# Patient Record
Sex: Female | Born: 1980 | Hispanic: No | Marital: Married | Smoking: Never smoker
Health system: Southern US, Community
[De-identification: ages and names within clinical notes are randomized; demographics above are authoritative.]

## PROBLEM LIST (undated history)

## (undated) HISTORY — PX: OTHER SURGICAL HISTORY: SHX169

---

## 2009-02-03 HISTORY — PX: OTHER SURGICAL HISTORY: SHX169

## 2009-02-03 HISTORY — PX: LIPOSUCTION EXTREMITIES: SUR830

## 2009-07-06 ENCOUNTER — Encounter: Payer: Self-pay | Admitting: Family Medicine

## 2010-03-05 NOTE — Consult Note (Signed)
Summary: Consultation Report  Consultation Report   Imported By: Marily Memos 07/06/2009 10:24:00  _____________________________________________________________________  External Attachment:    Type:   Image     Comment:   External Document

## 2010-04-09 ENCOUNTER — Ambulatory Visit (INDEPENDENT_AMBULATORY_CARE_PROVIDER_SITE_OTHER): Payer: BC Managed Care – PPO | Admitting: Sports Medicine

## 2010-04-09 ENCOUNTER — Encounter: Payer: Self-pay | Admitting: Sports Medicine

## 2010-04-09 DIAGNOSIS — M7650 Patellar tendinitis, unspecified knee: Secondary | ICD-10-CM | POA: Insufficient documentation

## 2010-04-09 DIAGNOSIS — M25569 Pain in unspecified knee: Secondary | ICD-10-CM

## 2010-04-16 NOTE — Assessment & Plan Note (Signed)
Summary: RUNNING EVAL/PATELLA TENDONITS   Vitals Entered By: Lillia Pauls CMA (April 09, 2010 11:35 AM)   CC:  patellar tendonitis.  History of Present Illness: [by Christin Gethers MSIV]  Beverly Ross is a 30 year old who presents  with a primary complaint of patellar tendonitis.  Approximately 14 months ago (Jan/2011) patient ran in a marathon using the [facility] training method. Two months later the patient went running with her brother-in-law (consistent run & at faster pace) and began experiencing discomfort in the area of her left patellar tendon.  6 months ago she was seen by Dr. Thurston Hole and diagnosed with patellar tendonitis.  She has tried rest, cho pat strap, NSAIDS, and none of them have provided much relief or a long term plan that was sufficient for the patient.  Two weeks ago she received a cortisone injection by Dr. Thurston Hole; which provided minimal relief with activity but did help baseline level of pain.    She describes the pain as a discomfort which feels like tightness/stiffness in her knee and mostly occurs after a workout; while running she does not experience much discmofort.   US demonstrated an inflamed L patellar tendon at 0.7 mm and a small L patellar avulsion.  Medications Prior to Update: 1)  None  Allergies (verified): 1)  ! Sulfa  Review of Systems       per HPI  Physical Exam  General:  alert, healthy-appearing, and cooperative to examination.  alert, healthy-appearing, and cooperative to examination.   Msk:  Left Knee: No erythema or effusion or obvious bony abnormalities. Atrophy of VMO. Palpation normal with no warmth or joint line tenderness or patellar tenderness or condyle tenderness. ROM normal in flexion and extension and lower leg rotation. ACL with solid consistent endpoint. No crepitus Negative Mcmurray's and provocative meniscal tests. Patellar and quadriceps tendons unremarkable. Hip adduction 5/5  Additional Exam:  MSK [place] the proximal  patellar tendon is 0.7 CM AP and this compares to 0.38 of prox RT PT There is a hypoechoic area at patellar insertion within this there is an area of calcification - possible avulsion marked neovessels are seen in this area of injury    Impression & Recommendations:  Problem # 1:  PATELLAR TENDINITIS (ICD-726.64) Prescribed nitrogylcerin: 1/4 patch applied once daily for 3-6 months. Advised to ice knee following work out for 15-20 mins. Instructed to work on quad strengthening exercises (daily) & given quad exercise sheet. Encouraged to use sling shot when running. Running permitted if continue 3/1 intervals, but no more than 20 mins for the first week and only increase by 5 mins/wk. Use insoles in tennis shoes. F/U appointment and rescan patellar tendon in 6 weeks.  Orders: Garment,belt,sleeve or other covering ,elastic or similar stretch (E4540) [place] LIMITED ([id number]) Sports Insoles [phone])  Problem # 2:  KNEE PAIN, LEFT (ICD-719.46)  can cont to sue symptomatic care add sports insoles for cushion  gradual dec in pain exp w ntg  Orders: [place] LIMITED ([id number]) Sports Insoles (H0865)  Complete Medication List: 1)  Nitroglycerin 0.2 Mg/hr Pt24 (Nitroglycerin) .... 1/4 patch once daily x 24 hours  Patient Instructions: 1)  quad exercises daily if possible 2)  on days that you do bike or stair master you can skip quad exercises 3)  ice knee after running 4)  keep running to 20 mins 3/1 for week 1  5)  increase no more than 5 mins per week 6)  use sports insoles 7)  sling shot patella  brace 8)  avoid squats, deep knee bends, etc 9)  recheck in 6 weeks and rescan 10)  use NTG daily - expect some headaches first 2 weeks Prescriptions: NITROGLYCERIN 0.2 MG/HR PT24 (NITROGLYCERIN) 1/4 patch once daily x 24 hours  #30 x 2   Entered and Authorized by:   Enid Baas MD   Signed by:   Enid Baas MD on 04/09/2010   Method used:   Electronically to        [facility]  [address]  (retail)       [address]       [place], [place]  [address]       Ph: [phone]       Fax: [phone]   RxID:   [id number]    Orders Added: 1)  Garment,belt,sleeve or other covering ,elastic or similar stretch [A4466] 2)  New Patient Level III [99203] 3)  [place] LIMITED [[id number]] 4)  Sports Insoles [L3510]

## 2010-05-22 ENCOUNTER — Encounter: Payer: Self-pay | Admitting: Sports Medicine

## 2010-05-22 ENCOUNTER — Ambulatory Visit (INDEPENDENT_AMBULATORY_CARE_PROVIDER_SITE_OTHER): Payer: BC Managed Care – PPO | Admitting: Sports Medicine

## 2010-05-22 VITALS — BP 105/68 | HR 53 | Ht 68.0 in | Wt 142.0 lb

## 2010-05-22 DIAGNOSIS — M7650 Patellar tendinitis, unspecified knee: Secondary | ICD-10-CM

## 2010-05-22 MED ORDER — NITROGLYCERIN 0.2 MG/HR TD PT24: MEDICATED_PATCH | TRANSDERMAL | Status: DC

## 2010-05-22 NOTE — Assessment & Plan Note (Signed)
Improved significantly -Continue nitroglycerin patch is at current dose, we gave a #30 refill -Okay to increase run walk intervals to 5 minutes to 1 minute and then gradually increase as tolerated -Continue rehabilitation exercises for the quads and patellar tendon, focus on eccentric strengthening -Continue use of patellar tendon strap -Followup in 6 weeks for repeat scan

## 2010-05-22 NOTE — Progress Notes (Signed)
  Subjective:    Patient ID: Beverly Ross, female    DOB: [birth year], 30 y.o.   MRN: [id number]  HPI 30 year old female for left patellar tendinitis. She has been doing the nitroglycerin patches as well as some rehabilitation exercises. She states that her pain is 80% improved. She has been doing three-minute jog to 1 minute walk intervals, and last night was able to do this for 44 minutes without pain. She still gets occasional stiffness, but is very pleased overall with her progress.   Review of Systems No fevers, chills, sweats, weight loss.    Objective:   Physical Exam Gen. appearance: Well-appearing female in no distress Left knee: Full range of motion, without effusion. Minimal tenderness to palpation at the inferior pole of the patella. No joint line tenderness to palpation. Gait: Mild outgoing of the left foot with running, but overall level shoulders, level pelvis, good knee stability, and no significant pronation.  Muscle skeletal ultrasound left knee: On longitudinal view, thickness is still 0.68 cm which is essentially unchanged. She persists in having a hypoechoic region near the inferior pole of the patella with a fair amount of calcification. There is greatly increased neovascularity and Doppler flow in this region. On transverse view, she can still see a hypoechoic gap of about 0.5 cm at the proximal portion of the tendon with increased Doppler flow.       Assessment & Plan:

## 2010-07-03 ENCOUNTER — Other Ambulatory Visit: Payer: BC Managed Care – PPO | Admitting: Sports Medicine

## 2010-08-20 ENCOUNTER — Encounter: Payer: Self-pay | Admitting: Sports Medicine

## 2010-08-20 ENCOUNTER — Ambulatory Visit (INDEPENDENT_AMBULATORY_CARE_PROVIDER_SITE_OTHER): Payer: BC Managed Care – PPO | Admitting: Sports Medicine

## 2010-08-20 VITALS — BP 112/72 | HR 61

## 2010-08-20 DIAGNOSIS — M25569 Pain in unspecified knee: Secondary | ICD-10-CM

## 2010-08-20 DIAGNOSIS — M7650 Patellar tendinitis, unspecified knee: Secondary | ICD-10-CM

## 2010-08-20 NOTE — Assessment & Plan Note (Signed)
clincially this is much improved  With ongoing neovessel  Change I would like her to stay on NTG for 2 more mos Repeat scan at that time  Add some more quad exercises  OK to run

## 2010-08-20 NOTE — Progress Notes (Signed)
  Subjective:    Patient ID: Beverly Ross, female    DOB: 03-16-1980, 30 y.o.   MRN: [id number]  HPI Now feeling about 80% better or even more Able to run 9.5 miles this past week No probs using the NTG No swelling Not doing exercises regularly but doing pilates Running 12 to 20 MPW   Review of Systems     Objective:   Physical Exam    NAD  Left knee Knee: Normal to inspection with no erythema or effusion or obvious bony abnormalities. Palpation normal with no warmth or joint line tenderness or patellar tenderness or condyle tenderness. ROM normal in flexion and extension and lower leg rotation. Ligaments with solid consistent endpoints including ACL, PCL, LCL, MCL. Negative Mcmurray's and provocative meniscal tests. Non painful patellar compression. Patellar and quadriceps tendons unremarkable. Hamstring and quadriceps strength is normal.  No real tenderness or swelling at inferior lateral margin of the patellar tendon  MSK [place] The patellar tendon is now 0.52 at thickest area vs 0.70 when Tx started There is still some marginal separation at insertion to patella laterally This now looks to involve only 10% of tendon There is calcification There is hypoechoic change with a dense calcification .5cm distal to insertion Transverse view shows that most of the gap has closed in prox tendon neovessel activity is still 4+ at proximal patellar tendon   Assessment & Plan:

## 2010-08-20 NOTE — Assessment & Plan Note (Signed)
This is much improved  Can use ice as needed and OTC meds

## 2010-08-22 ENCOUNTER — Other Ambulatory Visit: Payer: Self-pay | Admitting: *Deleted

## 2010-08-22 DIAGNOSIS — M7650 Patellar tendinitis, unspecified knee: Secondary | ICD-10-CM

## 2010-08-22 MED ORDER — NITROGLYCERIN 0.2 MG/HR TD PT24: MEDICATED_PATCH | TRANSDERMAL | Status: DC

## 2011-02-07 ENCOUNTER — Emergency Department: Payer: Self-pay | Admitting: *Deleted

## 2011-02-07 LAB — COMPREHENSIVE METABOLIC PANEL
Albumin: 3.9 g/dL (ref 3.4–5.0)
Alkaline Phosphatase: 82 U/L (ref 50–136)
Anion Gap: 8 (ref 7–16)
BUN: 7 mg/dL (ref 7–18)
Bilirubin,Total: 0.4 mg/dL (ref 0.2–1.0)
Calcium, Total: 8.5 mg/dL (ref 8.5–10.1)
Chloride: 112 mmol/L — ABNORMAL HIGH (ref 98–107)
Co2: 25 mmol/L (ref 21–32)
Creatinine: 0.65 mg/dL (ref 0.60–1.30)
EGFR (African American): >60
EGFR (Non-African Amer.): >60
Glucose: 79 mg/dL (ref 65–99)
Osmolality: 286 (ref 275–301)
Potassium: 3.5 mmol/L (ref 3.5–5.1)
SGOT(AST): 25 U/L (ref 15–37)
SGPT (ALT): 21 U/L
Sodium: 145 mmol/L (ref 136–145)
Total Protein: 6.9 g/dL (ref 6.4–8.2)

## 2011-02-07 LAB — URINALYSIS, COMPLETE
Bilirubin,UR: NEGATIVE
Blood: NEGATIVE
Glucose,UR: NEGATIVE mg/dL (ref 0–75)
Leukocyte Esterase: NEGATIVE
Nitrite: NEGATIVE
Ph: 5 (ref 4.5–8.0)
Protein: NEGATIVE
RBC,UR: 1 /HPF (ref 0–5)
Specific Gravity: 1.02 (ref 1.003–1.030)
Squamous Epithelial: 2
WBC UR: 2 /HPF (ref 0–5)

## 2011-02-07 LAB — CBC
HCT: 39.5 % (ref 35.0–47.0)
HGB: 13.2 g/dL (ref 12.0–16.0)
MCH: 30.9 pg (ref 26.0–34.0)
MCHC: 33.4 g/dL (ref 32.0–36.0)
MCV: 93 fL (ref 80–100)
Platelet: 211 10*3/uL (ref 150–440)
RBC: 4.27 10*6/uL (ref 3.80–5.20)
RDW: 12.5 % (ref 11.5–14.5)
WBC: 5.7 10*3/uL (ref 3.6–11.0)

## 2011-02-07 LAB — AMYLASE: Amylase: 35 U/L (ref 25–115)

## 2011-02-07 LAB — PREGNANCY, URINE: Pregnancy Test, Urine: NEGATIVE m[IU]/mL

## 2011-02-07 LAB — LIPASE, BLOOD: Lipase: 92 U/L (ref 73–393)

## 2011-02-22 ENCOUNTER — Emergency Department: Payer: Self-pay | Admitting: Emergency Medicine

## 2011-02-22 LAB — URINALYSIS, COMPLETE
Bilirubin,UR: NEGATIVE
Ketone: NEGATIVE
Ph: 5 (ref 4.5–8.0)
Protein: NEGATIVE
RBC,UR: <1 /HPF (ref 0–5)
Specific Gravity: 1.02 (ref 1.003–1.030)
Squamous Epithelial: 3
WBC UR: 1 /HPF (ref 0–5)

## 2011-02-22 LAB — CBC
MCV: 92 fL (ref 80–100)
Platelet: 281 10*3/uL (ref 150–440)
RBC: 4.62 10*6/uL (ref 3.80–5.20)
WBC: 8 10*3/uL (ref 3.6–11.0)

## 2011-02-22 LAB — COMPREHENSIVE METABOLIC PANEL
Anion Gap: 7 (ref 7–16)
BUN: 7 mg/dL (ref 7–18)
Bilirubin,Total: 0.4 mg/dL (ref 0.2–1.0)
Chloride: 110 mmol/L — ABNORMAL HIGH (ref 98–107)
Co2: 23 mmol/L (ref 21–32)
EGFR (African American): >60
Potassium: 4 mmol/L (ref 3.5–5.1)
SGOT(AST): 29 U/L (ref 15–37)
Total Protein: 7.3 g/dL (ref 6.4–8.2)

## 2011-02-22 LAB — LIPASE, BLOOD: Lipase: 92 U/L (ref 73–393)

## 2011-04-02 ENCOUNTER — Other Ambulatory Visit: Payer: Self-pay | Admitting: Gastroenterology

## 2017-01-24 ENCOUNTER — Ambulatory Visit
Admission: EM | Admit: 2017-01-24 | Discharge: 2017-01-24 | Disposition: A | Discharge status: Home / Self Care | Payer: BLUE CROSS/BLUE SHIELD | Attending: Family Medicine | Admitting: Family Medicine

## 2017-01-24 ENCOUNTER — Other Ambulatory Visit: Payer: Self-pay

## 2017-01-24 ENCOUNTER — Encounter: Payer: Self-pay | Admitting: Gynecology

## 2017-01-24 DIAGNOSIS — N3001 Acute cystitis with hematuria: Secondary | ICD-10-CM | POA: Diagnosis not present

## 2017-01-24 LAB — URINALYSIS, COMPLETE (UACMP) WITH MICROSCOPIC
Bilirubin Urine: NEGATIVE
GLUCOSE, UA: NEGATIVE mg/dL
Ketones, ur: NEGATIVE mg/dL
Nitrite: NEGATIVE
PH: 6.5 (ref 5.0–8.0)
Protein, ur: NEGATIVE mg/dL
Specific Gravity, Urine: 1.01 (ref 1.005–1.030)
Squamous Epithelial / LPF: NONE SEEN

## 2017-01-24 MED ORDER — NITROFURANTOIN MONOHYD MACRO 100 MG PO CAPS: 100.0000 mg | ORAL_CAPSULE | Freq: Two times a day (BID) | ORAL | 0 refills | Status: DC

## 2017-01-24 NOTE — ED Provider Notes (Signed)
[facility]    CSN: [id number] Arrival date & time: 01/24/17  1007  History   Chief Complaint Chief Complaint  Patient presents with  . Urinary Tract Infection   HPI  36 year old female presents with concerns for UTI.  Patient reports that she developed urinary frequency last night.  This morning she has noticed continued urinary frequency and urgency.  No dysuria.  She is noticed blood in her urine and with wiping.  No flank pain.  No fever.  No nausea or vomiting.  No medications or interventions tried.  No known exacerbating or relieving factors.  No other complaints at this time.  PMH: Hx of knee pain  Surgical Hx - Hx of C section x 2  Home Medications    Prior to Admission medications   Medication Sig Start Date End Date Taking? Authorizing Provider  nitrofurantoin, macrocrystal-monohydrate, (MACROBID) 100 MG capsule Take 1 capsule (100 mg total) by mouth 2 (two) times daily. 01/24/17   Tommie Samsook, Merica Prell G, DO   Family History Family History  Problem Relation Age of Onset  . Healthy Mother   . Healthy Father     Social History Social History   Tobacco Use  . Smoking status: Never Smoker  . Smokeless tobacco: Never Used  Substance Use Topics  . Alcohol use: Yes  . Drug use: No    Allergies   Sulfonamide derivatives   Review of Systems Review of Systems  Constitutional: Negative.   Genitourinary: Positive for frequency, hematuria and urgency. Negative for dysuria and flank pain.   Physical Exam Triage Vital Signs ED Triage Vitals [01/24/17 1027]  Enc Vitals Group     BP 109/64     Pulse Rate 72     Resp 16     Temp 98.4 F (36.9 C)     Temp Source Oral     SpO2 100 %     Weight 150 lb (68 kg)     Height 5\' 8"  (1.727 m)     Head Circumference      Peak Flow      Pain Score 1     Pain Loc      Pain Edu?      Excl. in GC?    No data found.  Updated Vital Signs BP 109/64 (BP Location: Left Arm)   Pulse 72   Temp 98.4 F (36.9 C)  (Oral)   Resp 16   Ht 5\' 8"  (1.727 m)   Wt 150 lb (68 kg)   LMP 01/10/2017   SpO2 100%   BMI 22.81 kg/m     Physical Exam  Constitutional: She is oriented to person, place, and time. She appears well-developed. No distress.  HENT:  Head: Normocephalic and atraumatic.  Eyes: Conjunctivae are normal.  Cardiovascular: Normal rate and regular rhythm.  No murmur heard. Pulmonary/Chest: Effort normal and breath sounds normal. No respiratory distress. She has no wheezes. She has no rales.  Abdominal: Soft. She exhibits no distension. There is no tenderness.  Neurological: She is alert and oriented to person, place, and time.  Psychiatric: She has a normal mood and affect. Her behavior is normal.  Vitals reviewed.  UC Treatments / Results  Labs (all labs ordered are listed, but only abnormal results are displayed) Labs Reviewed  URINALYSIS, COMPLETE (UACMP) WITH MICROSCOPIC - Abnormal; Notable for the following components:      Result Value   Color, Urine STRAW (*)    APPearance CLOUDY (*)  Hgb urine dipstick LARGE (*)    Leukocytes, UA LARGE (*)    Bacteria, UA RARE (*)    All other components within normal limits  URINE CULTURE    EKG  EKG Interpretation None       Radiology No results found.  Procedures Procedures (including critical care time)  Medications Ordered in UC Medications - No data to display   Initial Impression / Assessment and Plan / UC Course  I have reviewed the triage vital signs and the nursing notes.  Pertinent labs & imaging results that were available during my care of the patient were reviewed by me and considered in my medical decision making (see chart for details).    36 year old female presents with UTI.  Treating with Macrobid.  Final Clinical Impressions(s) / UC Diagnoses   Final diagnoses:  Acute cystitis with hematuria    ED Discharge Orders        Ordered    nitrofurantoin, macrocrystal-monohydrate, (MACROBID) 100 MG  capsule  2 times daily     01/24/17 1104     Controlled Substance Prescriptions [place] Controlled Substance Registry consulted? Not Applicable   Tommie SamsCook, Kayda Allers G, DO 01/24/17 1114

## 2017-01-24 NOTE — ED Triage Notes (Signed)
Per patient feel the urge to urinate and notice blood in urine x this morning.

## 2017-01-26 LAB — URINE CULTURE: Culture: 10000 — AB

## 2017-10-21 ENCOUNTER — Ambulatory Visit: Payer: BLUE CROSS/BLUE SHIELD

## 2017-11-05 ENCOUNTER — Ambulatory Visit: Payer: BLUE CROSS/BLUE SHIELD | Attending: Advanced Practice Midwife | Admitting: Physical Therapy

## 2017-11-05 ENCOUNTER — Encounter: Payer: Self-pay | Admitting: Physical Therapy

## 2017-11-05 ENCOUNTER — Other Ambulatory Visit: Payer: Self-pay

## 2017-11-05 DIAGNOSIS — M6208 Separation of muscle (nontraumatic), other site: Secondary | ICD-10-CM | POA: Insufficient documentation

## 2017-11-05 DIAGNOSIS — M6281 Muscle weakness (generalized): Secondary | ICD-10-CM | POA: Insufficient documentation

## 2017-11-05 DIAGNOSIS — R29898 Other symptoms and signs involving the musculoskeletal system: Secondary | ICD-10-CM | POA: Insufficient documentation

## 2017-11-05 NOTE — Therapy (Addendum)
[place] [facility] MAIN [facility] SERVICES [address] [place], [place], [address] Phone: [phone]   Fax:  [phone]  Physical Therapy Evaluation  Patient Details  Name: Beverly Ross MRN: [id number] Date of Birth: 06-08-1980 No data recorded  Encounter Date: 11/05/2017  PT End of Session - 11/05/17 1102    Visit Number  1    Number of Visits  12    PT Start Time  1007    PT Stop Time  1115    PT Time Calculation (min)  68 min       History reviewed. No pertinent past medical history.  History reviewed. No pertinent surgical history.  There were no vitals filed for this visit.   Subjective Assessment - 11/05/17 1014    Subjective  1) Pt had 2 children ( 2013, 2016) with Emergency C-sections. No complications after. Pt has noticed protusion of her belly button sicne her first pregnancy. Pt also has pain / discomfort/ hardness in her belly. Pt no longer does her ab workouts. Pt used to do crunches after her first pregnancy 37 year old. She would notice when she did the crunches, her stomach protruded. Pt recently tried to do crunches again but she noticed her stomach got real tight. Loaded activities: picks up 19 years 37 year old.  Fitness routine: spardoic, like to run 2-3 miles 2 days a week.  Pt would like to tone up and get back to pre-pregnancy body and to run more consistently.   2) R knee pain started in the spring 2019 when running at a faster speed than she normally did in a 5K. It gets exacerbated when she is not careful with she runs. Adding in walk breaks helps.  Pt has had pain in L knee 10 years ago which has resolved.        Pertinent History  Denied bowel issues. Occasional SUI.      Patient Stated Goals  feel better the abdominal area          [facility] PT Assessment - 11/05/17 1036      Observation/Other Assessments   Observations  L shoulder slightly lower      Coordination   Gross Motor Movements are Fluid and Coordinated  --    limited diaphragmatic excursion   Fine Motor Movements are Fluid and Coordinated  --   pelvic floor lengthening initially limited     Strength   Overall Strength Comments  knee ext, flex / hip flex L 3+/5, R 4/5        Palpation   Spinal mobility  increased tightness at thoracic     SI assessment   R PSIS more anterior, L lumbar slightly convex in L direction                Objective measurements completed on examination: See above findings.    Pelvic Floor Special Questions - 11/05/17 1041    Diastasis Recti  4 fingers width ( head lift with 2 fingers closure) above umbilicus)  ( post Tx: 1 fingers width with closure on headlift)        [facility] Adult PT Treatment/Exercise - 11/05/17 1907      Neuro Re-ed    Neuro Re-ed Details   see pt instructions      Manual Therapy   Manual therapy comments  quadriped position: fascial pulling over abdomen with shoulder flexion/ trunk rotation 10 reps B, STM at lower thoracic / posterior intercostals STM  Kinesiotex  Facilitate Muscle   above umbilicus "X"           PT Long Term Goals - 11/25/17 2324      PT LONG TERM GOAL #1   Title  Pt will demo decreased fingers width separation from 4 fingers with to < 2 fingers width in order to prepare for running     Time  4    Period  Weeks    Status  New    Target Date  12/23/17      PT LONG TERM GOAL #2   Title  Pt will demo increased L hip flexion strength from 3/5 to > 4 /5 in order to minimize risk for injuries with running    Time  8    Period  Weeks    Status  New    Target Date  01/20/18      PT LONG TERM GOAL #3   Title  Pt will demo proper alignment and technique with fitness routine and running mechanics to minimzie relapse of diastasis recti     Time  12    Period  Weeks    Status  New    Target Date  02/17/18      PT LONG TERM GOAL #4   Title  Pt will report decreased R knee pain by 50% in order to return to running and fitness    Time  4    Period   Weeks    Status  New                      Plan - 11/05/17 1906    Clinical Impression Statement  Pt is a 37 yo female who reports abdominal protrusion and R knee pain. These deficits impact her ADLs and QOL. Pt presented with deep core weakness with diastasis recti of 4 fingers width, pelvic obliquities, deviations to her spine (L convex curve), L shoulder lower than R, L hip weakness, and poor body mechanics, with limited education on proper ab exercises to minimize diastasis recti. Following Tx, pt showed decreased depth and separation of diastasis recti and was progressed to deep core strengthening.    Clinical Presentation  Evolving    Clinical Decision Making  Moderate    Rehab Potential  Good    PT Frequency  1x / week    PT Duration  12 weeks    PT Treatment/Interventions  Therapeutic activities;Functional mobility training;Neuromuscular re-education;Therapeutic exercise;Moist Heat;Aquatic Therapy;Biofeedback;Balance training;Gait training;Stair training;Traction;Patient/family education;Manual techniques;Electrical Stimulation;Scar mobilization;Manual lymph drainage;Energy conservation;Passive range of motion    Consulted and Agree with Plan of Care  Patient       Patient will benefit from skilled therapeutic intervention in order to improve the following deficits and impairments:  Improper body mechanics, Decreased scar mobility, Decreased safety awareness, Decreased strength, Decreased activity tolerance, Decreased range of motion, Decreased knowledge of use of DME, Pain, Postural dysfunction, Decreased endurance, Decreased balance, Abnormal gait, Decreased mobility, Increased muscle spasms, Hypomobility, Difficulty walking  Visit Diagnosis: Diastasis recti  Other symptoms and signs involving the musculoskeletal system  Muscle weakness (generalized)     Problem List Patient Active Problem List   Diagnosis Date Noted  . KNEE PAIN, LEFT 04/09/2010  . PATELLAR  TENDINITIS 04/09/2010    Mariane Masters ,PT, DPT, E-RYT  11/05/2017, 7:09 PM  [place] [facility] MAIN [facility] SERVICES [address] [place], [place], [address] Phone: [phone]   Fax:  [phone]  Name:  Beverly Ross MRN: [id number] Date of Birth: February 09, 1980

## 2017-11-05 NOTE — Patient Instructions (Signed)
  Proper body mechanics with getting out of a chair to decrease strain  on back &pelvic floor   Avoid holding your breath when Getting out of the chair:  Scoot to front part of chair chair Heels behind feet, feet are hip width apart, nose over toes  Inhale like you are smelling roses Exhale to stand     _________   Transition from standing to floor: Wide squat like you are about to pick something up from the floor --> crawl hand down on thigh and then reach other hand onto the ground (all fours)  To get up, all fours--> lifts hips in to Downward Facing Dog  and walk hands backwards to feet --> mini quat --> hands on thighs, then hips then pause HERE  to avoid (moving too quickly up/ blood rush) -->  knees glide forward and roll  Hips up instead of hinging spine up   * KEEP YOUR HEAD AND HEART LEVELLED , NEVER LETTING YOUR HEAD GET BELOW YOUR HEART   _______ Selina Cooley up your body with feet on the ground to engage the deep core  Sitting with both feet on the ground  _______  Avoid straining pelvic floor, abdominal muscles , spine  Use log rolling technique instead of getting out of bed with your neck or the sit-up   Log rolling into and out of .bed  With sidelying position first   _______   Deep core level 1-2 ( handout)

## 2017-11-25 NOTE — Addendum Note (Signed)
Addended by: Mariane Masters on: 11/25/2017 11:42 PM   Modules accepted: Orders

## 2017-12-11 ENCOUNTER — Ambulatory Visit: Payer: BLUE CROSS/BLUE SHIELD | Attending: Advanced Practice Midwife | Admitting: Physical Therapy

## 2017-12-11 DIAGNOSIS — R29898 Other symptoms and signs involving the musculoskeletal system: Secondary | ICD-10-CM

## 2017-12-11 DIAGNOSIS — M6208 Separation of muscle (nontraumatic), other site: Secondary | ICD-10-CM

## 2017-12-11 DIAGNOSIS — M6281 Muscle weakness (generalized): Secondary | ICD-10-CM

## 2017-12-11 NOTE — Patient Instructions (Signed)
Add another set of deep core level 2 ( in the car when picking up children)  ________________ Two sets of     band under ballmounds  while laying on back w/ knees bent  "W" exercise  10 reps x 2 sets   Band is placed under feet, knees bent, feet are hip width apart Hold band with thumbs point out, keep upper arm and elbow touching the bed the whole time  - inhale and then exhale pull bands by bending elbows hands move in a "w"  (feel shoulder blades squeezing)   Cross body  10 reps x 2 sets  ________________ Picking children: shoulders down and back, ( mini squat)  Holding children, not swayed at the hips  Knees unlocked, equal weight bearing in both feet,  Bilateral stance, or ski track ( semi tandem)

## 2017-12-11 NOTE — Therapy (Signed)
[facility] MAIN [facility] SERVICES [address] [place], [place], [address] Phone: [phone]   Fax:  [phone]  Physical Therapy Treatment  Patient Details  Name: Beverly Ross MRN: [id number] Date of Birth: October 17, 1980 Referring Provider (PT): [place]   Encounter Date: 12/11/2017  PT End of Session - 12/11/17 1558    Visit Number  2    Number of Visits  12    PT Start Time  0905    PT Stop Time  1000    PT Time Calculation (min)  55 min    Activity Tolerance  Patient tolerated treatment well    Behavior During Therapy  [facility] for tasks assessed/performed       No past medical history on file.  No past surgical history on file.  There were no vitals filed for this visit.  Subjective Assessment - 12/11/17 0910    Subjective  Pt practiced her deep core exercises once a day for the past weeks     Pertinent History  Denied bowel issues. Occasional SUI.      Patient Stated Goals  feel better the abdominal area          [facility] PT Assessment - 12/11/17 0956      Palpation   Spinal mobility  increaseed tightness at rib 5-12 intercostals and sternal fascial ,  upper trap, levator and medial mm to scapular                 Pelvic Floor Special Questions - 12/11/17 0955    Diastasis Recti  less depth, 2 fingers with crunch pre Tx, post Tx: 1 fingers width with head crunches        [facility] Adult PT Treatment/Exercise - 12/11/17 1008      Neuro Re-ed    Neuro Re-ed Details   see pt instructions      Manual Therapy   Manual therapy comments  sidelying: STM. MWM at problem areas, medial glides at thoracic segments                 PT Long Term Goals - 11/25/17 2324      PT LONG TERM GOAL #1   Title  Pt will demo decreased fingers width separation from 4 fingers with to < 2 fingers width in order to prepare for running     Time  4    Period  Weeks    Status  New    Target Date  12/23/17      PT LONG TERM GOAL #2   Title  Pt will demo increased L hip flexion strength from 3/5 to > 4 /5 in order to minimize risk for injuries with running    Time  8    Period  Weeks    Status  New    Target Date  01/20/18      PT LONG TERM GOAL #3   Title  Pt will demo proper alignment and technique with fitness routine and running mechanics to minimzie relapse of diastasis recti     Time  12    Period  Weeks    Status  New    Target Date  02/17/18      PT LONG TERM GOAL #4   Title  Pt will report decreased R knee pain by 50% in order to return to running and fitness    Time  4    Period  Weeks    Status  New  Plan - 12/11/17 1559    Clinical Impression Statement  Pt demo'd decrease abdominal separation today which indicates good carry over form past session. Addressed fascial restrictions over sternum/ lower anterior/ posterior ribs and thoracic joints to promote depression of thorax and mobilty ot lower ribs to continue bringing closure to diastasis recti above umbilicus. Advanced pt to thoracolumbar/ oblique strengthening with lifting mechanics with capular retraction/ depression to decrease thoracic flexion/ shortening of diaphragm. Strategized with pt ways to increase repetitions of exercises during her schedule. Pt continues to benefit from skilled PT.     Rehab Potential  Good    PT Frequency  1x / week    PT Duration  12 weeks    PT Treatment/Interventions  Therapeutic activities;Functional mobility training;Neuromuscular re-education;Therapeutic exercise;Moist Heat;Aquatic Therapy;Biofeedback;Balance training;Gait training;Stair training;Traction;Patient/family education;Manual techniques;Electrical Stimulation;Scar mobilization;Manual lymph drainage;Energy conservation;Passive range of motion    Consulted and Agree with Plan of Care  Patient       Patient will benefit from skilled therapeutic intervention in order to improve the following deficits and impairments:  Improper body mechanics,  Decreased scar mobility, Decreased safety awareness, Decreased strength, Decreased activity tolerance, Decreased range of motion, Decreased knowledge of use of DME, Pain, Postural dysfunction, Decreased endurance, Decreased balance, Abnormal gait, Decreased mobility, Increased muscle spasms, Hypomobility, Difficulty walking  Visit Diagnosis: Diastasis recti  Other symptoms and signs involving the musculoskeletal system  Muscle weakness (generalized)     Problem List Patient Active Problem List   Diagnosis Date Noted  . KNEE PAIN, LEFT 04/09/2010  . PATELLAR TENDINITIS 04/09/2010    Mariane Masters ,PT, DPT, E-RYT  12/11/2017, 4:03 PM  [place] [facility] MAIN [facility] SERVICES [address] [place], [place], [address] Phone: [phone]   Fax:  [phone]  Name: Beverly Ross MRN: [id number] Date of Birth: 06-Mar-1980

## 2017-12-15 ENCOUNTER — Encounter: Payer: BLUE CROSS/BLUE SHIELD | Admitting: Physical Therapy

## 2017-12-28 ENCOUNTER — Ambulatory Visit
Admission: EM | Admit: 2017-12-28 | Discharge: 2017-12-28 | Disposition: A | Discharge status: Home / Self Care | Payer: BLUE CROSS/BLUE SHIELD | Attending: Family Medicine | Admitting: Family Medicine

## 2017-12-28 ENCOUNTER — Other Ambulatory Visit: Payer: Self-pay

## 2017-12-28 ENCOUNTER — Encounter: Payer: Self-pay | Admitting: Emergency Medicine

## 2017-12-28 DIAGNOSIS — N3001 Acute cystitis with hematuria: Secondary | ICD-10-CM | POA: Diagnosis not present

## 2017-12-28 LAB — URINALYSIS, COMPLETE (UACMP) WITH MICROSCOPIC
Bilirubin Urine: NEGATIVE
GLUCOSE, UA: NEGATIVE mg/dL
Ketones, ur: NEGATIVE mg/dL
Nitrite: POSITIVE — AB
PH: 6.5 (ref 5.0–8.0)
Specific Gravity, Urine: 1.01 (ref 1.005–1.030)
WBC, UA: >50 WBC/hpf (ref 0–5)

## 2017-12-28 MED ORDER — CEPHALEXIN 500 MG PO CAPS: 500.0000 mg | ORAL_CAPSULE | Freq: Two times a day (BID) | ORAL | 0 refills | Status: DC

## 2017-12-28 NOTE — ED Triage Notes (Signed)
Patient c/o urinary frequency and urgency that started this morning.

## 2017-12-28 NOTE — ED Provider Notes (Signed)
[facility]    CSN: [id number] Arrival date & time: 12/28/17  1532  History   Chief Complaint Chief Complaint  Patient presents with  . Urinary Frequency   HPI  37 year old female presents with urinary symptoms.  Started abruptly this morning.  She reports urinary frequency and urgency.  No fever.  No back pain.  No abdominal pain.  No flank pain.  No medications or interventions tried.  No known exacerbating factors.  Denies dysuria.  No other associated symptoms.  No other complaints.  Social Hx reviewed as below. Social History   Tobacco Use  . Smoking status: Never Smoker  . Smokeless tobacco: Never Used  Substance Use Topics  . Alcohol use: Yes  . Drug use: No   Allergies   Sulfonamide derivatives  Review of Systems Review of Systems  Constitutional: Negative.   Genitourinary: Positive for frequency and urgency. Negative for dysuria.   Physical Exam Triage Vital Signs ED Triage Vitals  Enc Vitals Group     BP 12/28/17 1632 101/76     Pulse Rate 12/28/17 1632 (!) 56     Resp 12/28/17 1632 18     Temp 12/28/17 1632 98.7 F (37.1 C)     Temp Source 12/28/17 1632 Oral     SpO2 12/28/17 1632 100 %     Weight 12/28/17 1631 145 lb (65.8 kg)     Height 12/28/17 1631 5\' 8"  (1.727 m)     Head Circumference --      Peak Flow --      Pain Score 12/28/17 1631 0     Pain Loc --      Pain Edu? --    Updated Vital Signs BP 101/76 (BP Location: Right Arm)   Pulse (!) 56   Temp 98.7 F (37.1 C) (Oral)   Resp 18   Ht 5\' 8"  (1.727 m)   Wt 65.8 kg   LMP 12/07/2017   SpO2 100%   BMI 22.05 kg/m   Visual Acuity Right Eye Distance:   Left Eye Distance:   Bilateral Distance:    Right Eye Near:   Left Eye Near:    Bilateral Near:     Physical Exam  Constitutional: She is oriented to person, place, and time. She appears well-developed. No distress.  Cardiovascular: Regular rhythm.  Bradycardic.  Pulmonary/Chest: Effort normal and breath sounds  normal. She has no wheezes. She has no rales.  Abdominal: Soft. She exhibits no distension. There is no tenderness.  Neurological: She is alert and oriented to person, place, and time.  Psychiatric: She has a normal mood and affect. Her behavior is normal.  Nursing note and vitals reviewed.  UC Treatments / Results  Labs (all labs ordered are listed, but only abnormal results are displayed) Labs Reviewed  URINALYSIS, COMPLETE (UACMP) WITH MICROSCOPIC - Abnormal; Notable for the following components:      Result Value   Color, Urine STRAW (*)    APPearance HAZY (*)    Hgb urine dipstick LARGE (*)    Protein, ur TRACE (*)    Nitrite POSITIVE (*)    Leukocytes, UA LARGE (*)    Bacteria, UA RARE (*)    All other components within normal limits    EKG None  Radiology No results found.  Procedures Procedures (including critical care time)  Medications Ordered in UC Medications - No data to display  Initial Impression / Assessment and Plan / UC Course  I have reviewed the triage  vital signs and the nursing notes.  Pertinent labs & imaging results that were available during my care of the patient were reviewed by me and considered in my medical decision making (see chart for details).    37 year old female presents with UTI. Treating with Keflex. Sending culture.  Final Clinical Impressions(s) / UC Diagnoses   Final diagnoses:  Acute cystitis with hematuria     Discharge Instructions     We will call with the results.  Take care  Dr. Adriana Simasook    ED Prescriptions    Medication Sig Dispense Auth. Provider   cephALEXin (KEFLEX) 500 MG capsule Take 1 capsule (500 mg total) by mouth 2 (two) times daily. 14 capsule Tommie Samsook, Isidoro Santillana G, DO     Controlled Substance Prescriptions [place] Controlled Substance Registry consulted? Not Applicable   Tommie SamsCook, Nazar Kuan G, DO 12/28/17 2115

## 2017-12-28 NOTE — Discharge Instructions (Signed)
We will call with the results.  Take care  Dr. Lakysha Kossman  

## 2018-01-10 ENCOUNTER — Encounter: Payer: Self-pay | Admitting: Gynecology

## 2018-01-10 ENCOUNTER — Ambulatory Visit
Admission: EM | Admit: 2018-01-10 | Discharge: 2018-01-10 | Disposition: A | Discharge status: Home / Self Care | Payer: BLUE CROSS/BLUE SHIELD | Attending: Family Medicine | Admitting: Family Medicine

## 2018-01-10 DIAGNOSIS — R300 Dysuria: Secondary | ICD-10-CM | POA: Diagnosis not present

## 2018-01-10 DIAGNOSIS — N390 Urinary tract infection, site not specified: Secondary | ICD-10-CM | POA: Diagnosis not present

## 2018-01-10 DIAGNOSIS — R319 Hematuria, unspecified: Secondary | ICD-10-CM

## 2018-01-10 LAB — URINALYSIS, COMPLETE (UACMP) WITH MICROSCOPIC
Bilirubin Urine: NEGATIVE
Glucose, UA: 100 mg/dL — AB
KETONES UR: NEGATIVE mg/dL
NITRITE: POSITIVE — AB
PH: 6 (ref 5.0–8.0)
Specific Gravity, Urine: 1.01 (ref 1.005–1.030)

## 2018-01-10 MED ORDER — NITROFURANTOIN MONOHYD MACRO 100 MG PO CAPS: 100.0000 mg | ORAL_CAPSULE | Freq: Two times a day (BID) | ORAL | 0 refills | Status: DC

## 2018-01-10 NOTE — ED Provider Notes (Signed)
[facility]    CSN: [id number] Arrival date & time: 01/10/18  0802     History   Chief Complaint No chief complaint on file.   HPI Beverly Ross is a 37 y.o. female.   The history is provided by the patient.  Dysuria  Pain quality:  Unable to specify Pain severity:  Mild Onset quality:  Sudden Duration:  2 days Timing:  Constant Progression:  Worsening Chronicity:  New Recent urinary tract infections: no   Relieved by:  Nothing Urinary symptoms: frequent urination   Urinary symptoms: no hematuria   Associated symptoms: no abdominal pain, no fever, no flank pain, no nausea and no vomiting   Risk factors: no hx of pyelonephritis, no hx of urolithiasis, no kidney transplant, not pregnant, no recurrent urinary tract infections, no renal cysts, no renal disease, no single kidney and no urinary catheter     History reviewed. No pertinent past medical history.  Patient Active Problem List   Diagnosis Date Noted  . KNEE PAIN, LEFT 04/09/2010  . PATELLAR TENDINITIS 04/09/2010    No past surgical history on file.  OB History   None      Home Medications    Prior to Admission medications   Medication Sig Start Date End Date Taking? Authorizing Provider  cephALEXin (KEFLEX) 500 MG capsule Take 1 capsule (500 mg total) by mouth 2 (two) times daily. 12/28/17   Tommie Sams, DO  nitrofurantoin, macrocrystal-monohydrate, (MACROBID) 100 MG capsule Take 1 capsule (100 mg total) by mouth 2 (two) times daily. 01/10/18   Payton Mccallum, MD    Family History Family History  Problem Relation Age of Onset  . Healthy Mother   . Stroke Father     Social History Social History   Tobacco Use  . Smoking status: Never Smoker  . Smokeless tobacco: Never Used  Substance Use Topics  . Alcohol use: Yes  . Drug use: No     Allergies   Sulfonamide derivatives   Review of Systems Review of Systems  Constitutional: Negative for fever.  Gastrointestinal:  Negative for abdominal pain, nausea and vomiting.  Genitourinary: Positive for dysuria. Negative for flank pain.     Physical Exam Triage Vital Signs ED Triage Vitals  Enc Vitals Group     BP --      Pulse Rate 01/10/18 0815 61     Resp 01/10/18 0815 16     Temp 01/10/18 0815 98 F (36.7 C)     Temp Source 01/10/18 0815 Oral     SpO2 01/10/18 0815 100 %     Weight 01/10/18 0812 146 lb (66.2 kg)     Height --      Head Circumference --      Peak Flow --      Pain Score 01/10/18 0812 0     Pain Loc --      Pain Edu? --      Excl. in GC? --    No data found.  Updated Vital Signs Pulse 61   Temp 98 F (36.7 C) (Oral)   Resp 16   Wt 66.2 kg   LMP 12/26/2017   SpO2 100%   BMI 22.20 kg/m   Visual Acuity Right Eye Distance:   Left Eye Distance:   Bilateral Distance:    Right Eye Near:   Left Eye Near:    Bilateral Near:     Physical Exam  Constitutional: She appears well-developed and well-nourished. No distress.  Abdominal: Soft. Bowel sounds are normal. She exhibits no distension and no mass. There is tenderness. There is no rebound and no guarding.  Skin: She is not diaphoretic.  Nursing note and vitals reviewed.    UC Treatments / Results  Labs (all labs ordered are listed, but only abnormal results are displayed) Labs Reviewed  URINALYSIS, COMPLETE (UACMP) WITH MICROSCOPIC - Abnormal; Notable for the following components:      Result Value   Color, Urine ORANGE (*)    APPearance HAZY (*)    Glucose, UA 100 (*)    Hgb urine dipstick TRACE (*)    Protein, ur TRACE (*)    Nitrite POSITIVE (*)    Leukocytes, UA SMALL (*)    Bacteria, UA FEW (*)    All other components within normal limits  URINE CULTURE  PREGNANCY, URINE    EKG None  Radiology No results found.  Procedures Procedures (including critical care time)  Medications Ordered in UC Medications - No data to display  Initial Impression / Assessment and Plan / UC Course  I have  reviewed the triage vital signs and the nursing notes.  Pertinent labs & imaging results that were available during my care of the patient were reviewed by me and considered in my medical decision making (see chart for details).      Final Clinical Impressions(s) / UC Diagnoses   Final diagnoses:  Urinary tract infection with hematuria, site unspecified   Discharge Instructions   None    ED Prescriptions    Medication Sig Dispense Auth. Provider   nitrofurantoin, macrocrystal-monohydrate, (MACROBID) 100 MG capsule Take 1 capsule (100 mg total) by mouth 2 (two) times daily. 14 capsule Payton Mccallumonty, Malvin Morrish, MD     1. Lab results and diagnosis reviewed with patient 2. rx as per orders above; reviewed possible side effects, interactions, risks and benefits  3. Recommend supportive treatment with increased water intake 4. Follow-up prn if symptoms worsen or don't improve   Controlled Substance Prescriptions [place] Controlled Substance Registry consulted? Not Applicable   Payton Mccallumonty, Brode Sculley, MD 01/10/18 [phone]

## 2018-01-10 NOTE — ED Triage Notes (Signed)
Patient c/o urine frequency

## 2018-01-12 LAB — URINE CULTURE

## 2018-01-14 ENCOUNTER — Telehealth (HOSPITAL_COMMUNITY): Payer: Self-pay | Admitting: Emergency Medicine

## 2018-01-14 NOTE — Telephone Encounter (Signed)
Urine culture was positive for ESCHERICHIA COLI  and was given macrobid  at urgent care visit. Pt contacted and made aware, educated on completing antibiotic and to follow up if symptoms are persistent. Verbalized understanding.    

## 2018-01-19 ENCOUNTER — Ambulatory Visit: Payer: BLUE CROSS/BLUE SHIELD | Attending: Advanced Practice Midwife | Admitting: Physical Therapy

## 2018-01-19 DIAGNOSIS — R29898 Other symptoms and signs involving the musculoskeletal system: Secondary | ICD-10-CM | POA: Insufficient documentation

## 2018-01-19 DIAGNOSIS — M6208 Separation of muscle (nontraumatic), other site: Secondary | ICD-10-CM | POA: Insufficient documentation

## 2018-01-19 DIAGNOSIS — M6281 Muscle weakness (generalized): Secondary | ICD-10-CM | POA: Diagnosis present

## 2018-01-19 DIAGNOSIS — M791 Myalgia, unspecified site: Secondary | ICD-10-CM | POA: Diagnosis not present

## 2018-01-19 NOTE — Patient Instructions (Signed)
Stretches before and after running    Lengthening pelvic floor with inhale after urination    Avoid crossing legs     Deep core level 3 on bed Keep level 2 seated

## 2018-01-21 NOTE — Therapy (Signed)
[facility] [address] [place], [place], [address] Phone: [phone]   Fax:  [phone]  Physical Therapy Treatment  Patient Details  Name: Beverly Ross MRN: [id number] Date of Birth: 03/29/1980 Referring Provider (PT): Masaake   Encounter Date: 01/19/2018    Subjective Assessment - 01/21/18 0008    Subjective  Pt had 2 UTIs back to back. One week and half ago, pt had her 2 nd UTI. Pt completed her antibiotics. [place] [employer] and did not feel the pooch after eating dinner at the end of the day     Pertinent History  Denied bowel issues. Occasional SUI.      Patient Stated Goals  feel better the abdominal area          [facility] Assessment - 01/20/18 2359      Palpation   Palpation comment  fascial restrictions over C-sections,                 Pelvic Floor Special Questions - 01/21/18 0000    Diastasis Recti  1 fingers width along linea alba    umbilicus region: skin loose, less bulging    External Perineal Exam  increased tightness at anterior triangle mm B         [facility] Adult PT Treatment/Exercise - 01/20/18 2358      Neuro Re-ed    Neuro Re-ed Details   see pt instructions      Manual Therapy   Manual therapy comments  C-sections scar releases ,   external: anterior triangle mm B                  PT Long Term Goals - 11/25/17 2324      PT LONG TERM GOAL #1   Title  Pt will demo decreased fingers width separation from 4 fingers with to < 2 fingers width in order to prepare for running     Time  4    Period  Weeks    Status  New    Target Date  12/23/17      PT LONG TERM GOAL #2   Title  Pt will demo increased L hip flexion strength from 3/5 to > 4 /5 in order to minimize risk for injuries with running    Time  8    Period  Weeks    Status  New    Target Date  01/20/18      PT LONG TERM GOAL #3   Title  Pt will demo proper alignment and technique with  fitness routine and running mechanics to minimzie relapse of diastasis recti     Time  12    Period  Weeks    Status  New    Target Date  02/17/18      PT LONG TERM GOAL #4   Title  Pt will report decreased R knee pain by 50% in order to return to running and fitness    Time  4    Period  Weeks    Status  New            Plan - 01/21/18 0002    Clinical Impression Statement  Pt's diastasis recti is improving. Addressed lower abdominal fascia/ C-section scar restrictions/ anterior pelvic floor mm tightness with external manual Tx which pt tolerated without complaint of pain.  Educated pt on the role of fascial mobility over C-section scars and mm mobility  of anterior pelvic floor mm to help promote optimal pelvic floor lengthening during urination in order to minimize risk for recurrent UTIs.  Withheld internal pelvic floor assessment today.  Plan to progress with regional interdependent approaches to help pt run with less risks for injuries/ pelvic floor dysfunctions 2/2  post-partum musculoskeletal changes.  Pt continues to benefit from skilled PT.      Rehab Potential  Good    PT Frequency  1x / week    PT Duration  12 weeks    PT Treatment/Interventions  Therapeutic activities;Functional mobility training;Neuromuscular re-education;Therapeutic exercise;Moist Heat;Aquatic Therapy;Biofeedback;Balance training;Gait training;Stair training;Traction;Patient/family education;Manual techniques;Electrical Stimulation;Scar mobilization;Manual lymph drainage;Energy conservation;Passive range of motion    Consulted and Agree with Plan of Care  Patient       Patient will benefit from skilled therapeutic intervention in order to improve the following deficits and impairments:  Improper body mechanics, Decreased scar mobility, Decreased safety awareness, Decreased strength, Decreased activity tolerance, Decreased range of motion, Decreased knowledge of use of DME, Pain, Postural dysfunction,  Decreased endurance, Decreased balance, Abnormal gait, Decreased mobility, Increased muscle spasms, Hypomobility, Difficulty walking  Visit Diagnosis: Diastasis recti  Other symptoms and signs involving the musculoskeletal system  Muscle weakness (generalized)     Problem List Patient Active Problem List   Diagnosis Date Noted  . KNEE PAIN, LEFT 04/09/2010  . PATELLAR TENDINITIS 04/09/2010    Mariane MastersYeung,Shin Yiing ,PT, DPT, E-RYT  01/21/2018, 12:08 AM  [place] [facility]IN [facility] [address] [place], [place], [address] Phone: [phone]   Fax:  [phone]  Name: Beverly Ross MRN: [id number] Date of Birth: 07/07/1980

## 2018-01-26 ENCOUNTER — Ambulatory Visit: Payer: BLUE CROSS/BLUE SHIELD | Admitting: Physical Therapy

## 2018-01-26 DIAGNOSIS — M6208 Separation of muscle (nontraumatic), other site: Secondary | ICD-10-CM

## 2018-01-26 DIAGNOSIS — R29898 Other symptoms and signs involving the musculoskeletal system: Secondary | ICD-10-CM

## 2018-01-26 DIAGNOSIS — M6281 Muscle weakness (generalized): Secondary | ICD-10-CM

## 2018-01-26 NOTE — Patient Instructions (Addendum)
  Clam Shell 45 Degrees for R hip strength On the R only next 2 weeks   30 reps x 2 day  sidelying or seated with green band   ______  Complimentary stretch: Cross _ foot over _ thigh, opposite knee straight  3 breaths   Cross thigh over thigh, exhale to hug the thighs in with arms pulling back of thigh, shoulders/ head is relaxed down , Use towel behind thigh is needed.  10 reps  _______   Multifidis twist for low back  Band is on doorknob: stand further away from door (facing perpendicular)   Twisting trunk without moving the hips and knees Hold band at the level of ribcage, elbows bent,shoulder blades roll back and down like squeezing a pencil under armpit    Exhale twist,.10-15 deg away from door without moving your hips/ knees. Continue to maintain equal weight through legs. Keep knee unlocked.  10 x 2   _________  Helping stregnthen and align knee in relationship to hip and foot  Single leg with band under ballmounds ( wathc video for cues)  20 reps    _____  Deep core level 1-2   Practice proper pelvic floor coordination  Inhale: expand pelvic floor muscles Exhale" "j" scoop, allow pelvic floor to close, lift first before belly sinks   ( not "draw abdominal muscle to spine" or strain with abdominal muscles")

## 2018-01-26 NOTE — Therapy (Addendum)
[facility] [address] [place], [place], [address] Phone: [phone]   Fax:  [phone]  Physical Therapy Treatment  Patient Details  Name: Beverly Ross MRN: [id number] Date of Birth: 07/12/1980 Referring Provider (PT): Masaake   Encounter Date: 01/26/2018  PT End of Session - 01/26/18 0852    Visit Number  4    Number of Visits  12    PT Start Time  0805    PT Stop Time  0911    PT Time Calculation (min)  66 min    Activity Tolerance  Patient tolerated treatment well    Behavior During Therapy  [facility] for tasks assessed/performed       No past medical history on file.  No past surgical history on file.  There were no vitals filed for this visit.  Subjective Assessment - 01/26/18 0838    Subjective  Pt is trying to continue to get her exercises into her schedule while carrying for her kids    Pertinent History  Denied bowel issues. Occasional SUI.      Patient Stated Goals  feel better the abdominal area          [facility] - 01/26/18 0841      Coordination   Gross Motor Movements are Fluid and Coordinated  --   delayed multifidis comapred to glut/ hamstringsB   Fine Motor Movements are Fluid and Coordinated  --   increased lumbar lordosis w/ posterior sling test     Single Leg Stance   Comments  adducted patella BLE, toe gripping       Strength   Overall Strength Comments  hip/knee flexion/ glut/ ankle eversion/DF and PF/INV 4/5,  hip abd 4/5 L, 4-/5 R    L PF standing15 reps, 22 reps R, 5/5 MMT       Palpation   Palpation comment  R ASIS more anterior and higher.  L patella pole slightly less mobile than R,                  Pelvic Floor Special Questions - 01/26/18 0843    Pelvic Floor Internal Exam  pt consented verbally without contraindications    Exam Type  Vaginal    Palpation  no tenderness/ tensions . delayed lengthening initially, and overuse of upper ab          [facility]/Ex[facility] - 01/26/18 0907      Neuro Re-ed    Neuro Re-ed Details   see pt instructions      Exercises   Exercises  --   see pt instructions                 PT Long Term Goals - 01/26/18 1112      PT LONG TERM GOAL #1   Title  Pt will demo decreased fingers width separation from 4 fingers with to < 2 fingers width in order to prepare for running     Time  4    Period  Weeks    Status  Achieved      PT LONG TERM GOAL #2   Title  Pt will demo increased L hip flexion strength from 3/5 to > 4 /5 in order to minimize risk for injuries with running    Time  8    Period  Weeks    Status  Achieved      PT LONG TERM GOAL #3  Title  Pt will demo proper alignment and technique with fitness routine and running mechanics to minimzie relapse of diastasis recti     Time  12    Period  Weeks    Status  On-going      PT LONG TERM GOAL #4   Title  Pt will report decreased R knee pain by 50% in order to return to running and fitness    Time  4    Period  Weeks    Status  On-going      PT LONG TERM GOAL #5   Title  Pt will demo less adducted patella, genu valgus with running in order to minimize risks for injuries    Time  8    Period  Weeks    Status  New    Target Date  New            Plan - 01/26/18 1105    Clinical Impression Statement Pt has achieved 2/5 goals with resolving diastasis recti, improved deep core strength.  Pt showed decreased pelvic floor mm tensions compared to past session and progressed to proper pelvic floor co-activation with abdominal mm to facilitate fascial tensigrity at umbilical hernia.  Pt still appeared with R iliac crest in anterior rotation and higher height compared to L with R hip abduction weakness. Assessed knees today which showed minor hypomobility at pole of L patella, adducted patella with toe gripping in bilateral stance, genu valgus in SLS. Provided excessive neurore-educaiton for more hip abduction  and proper knee alignment.  Initiated lower kinetic chain strengthening to prep pt to train for a running race in March with less knee pain and minimizing risks for pelvic floor dysfunction. Pt contines to benefit from skilled PT.      Rehab Potential  Good    PT Frequency  1x / week    PT Duration  12 weeks    PT Treatment/Interventions  Therapeutic activities;Functional mobility training;Neuromuscular re-education;Therapeutic exercise;Moist Heat;Aquatic Therapy;Biofeedback;Balance training;Gait training;Stair training;Traction;Patient/family education;Manual techniques;Electrical Stimulation;Scar mobilization;Manual lymph drainage;Energy conservation;Passive range of motion    Consulted and Agree with Plan of Care  Patient       Patient will benefit from skilled therapeutic intervention in order to improve the following deficits and impairments:  Improper body mechanics, Decreased scar mobility, Decreased safety awareness, Decreased strength, Decreased activity tolerance, Decreased range of motion, Decreased knowledge of use of DME, Pain, Postural dysfunction, Decreased endurance, Decreased balance, Abnormal gait, Decreased mobility, Increased muscle spasms, Hypomobility, Difficulty walking  Visit Diagnosis: Diastasis recti  Other symptoms and signs involving the musculoskeletal system  Muscle weakness (generalized)     Problem List Patient Active Problem List   Diagnosis Date Noted  . KNEE PAIN, LEFT 04/09/2010  . PATELLAR TENDINITIS 04/09/2010    Mariane MastersYeung,Shin Yiing ,PT, DPT, E-RYT  01/26/2018, 11:13 AM  [facility] [address] [place], [place], [address] Phone: [phone]   Fax:  [phone]  Name: Beverly Ross MRN: [id number] Date of Birth: 06/28/1980

## 2018-02-11 ENCOUNTER — Ambulatory Visit: Payer: BLUE CROSS/BLUE SHIELD | Attending: Advanced Practice Midwife | Admitting: Physical Therapy

## 2018-02-11 DIAGNOSIS — M6281 Muscle weakness (generalized): Secondary | ICD-10-CM | POA: Insufficient documentation

## 2018-02-11 DIAGNOSIS — M6208 Separation of muscle (nontraumatic), other site: Secondary | ICD-10-CM | POA: Insufficient documentation

## 2018-02-11 DIAGNOSIS — R29898 Other symptoms and signs involving the musculoskeletal system: Secondary | ICD-10-CM | POA: Insufficient documentation

## 2018-02-11 NOTE — Patient Instructions (Signed)
3 min Step back with ballmound pressed down on R foot, L arm up in half "V"  , shoulder down, R arm by side like skiing  Make sure the support leg and foot is strongly planted   Refine the exercise from last session,  Bring thigh up of the back leg, keep pelvic squared   Walking and running with slight lean forward of trunk, land more on midfoot and ballmounds and less on outside of feet, swinging thighs higher to land softer    Make sure to stretch after running  Hip flexor,  Calves Hamstring Quad    When doing Deep core 3 ( lift foot up slightly not too high to minimize wobbliness of pelvis)

## 2018-02-11 NOTE — Therapy (Signed)
[place] MAIN [facility] SERVICES [address] [place], [place], [address] Phone: [phone]   Fax:  [phone]  Physical Therapy Treatment  Patient Details  Name: Beverly Ross MRN: [id number] Date of Birth: 1980-05-10 Referring Provider (PT): Masaake   Encounter Date: 02/11/2018  PT End of Session - 02/11/18 0908    Visit Number  5    Number of Visits  12    PT Start Time  0908    PT Stop Time  1000    PT Time Calculation (min)  52 min    Activity Tolerance  Patient tolerated treatment well    Behavior During Therapy  [facility] for tasks assessed/performed       No past medical history on file.  No past surgical history on file.  There were no vitals filed for this visit.  Subjective Assessment - 02/11/18 0908    Subjective  Pt     Pertinent History  Denied bowel issues. Occasional SUI.      Patient Stated Goals  feel better the abdominal area          [facility] PT Assessment - 02/11/18 6712      Observation/Other Assessments   Observations  poor propioception with SLS exercises, tendency to supinate, ankle inversion 2/2 high arches   genu valus     Strength   Overall Strength Comments  hip abd / ext 4/5B,  posterior sling  : thoracolumbar) with lumbopertubation       Palpation   Palpation comment  equal pelvic girdle       Ambulation/Gait   Gait Comments  running assessment: treadmill:    posterior COM, heavy landing,decreased hip/knee flex,pushoff               Pelvic Floor Special Questions - 02/11/18 1032    Diastasis Recti  firmer low abdominal area , resolved DRA         [facility] Adult PT Treatment/Exercise - 02/11/18 0922      Therapeutic Activites    Therapeutic Activities  --   explained mechanics to running with proper propioception     Neuro Re-ed    Neuro Re-ed Details   cued for propioception, sride length to minimize knee crepitusB , cued for scapular stabilization                    PT  Long Term Goals - 02/11/18 1031      PT LONG TERM GOAL #1   Title  Pt will demo decreased fingers width separation from 4 fingers with to < 2 fingers width in order to prepare for running     Time  4    Period  Weeks    Status  Achieved      PT LONG TERM GOAL #2   Title  Pt will demo increased L hip flexion strength from 3/5 to > 4 /5 in order to minimize risk for injuries with running    Time  8    Period  Weeks    Status  Achieved      PT LONG TERM GOAL #3   Title  Pt will demo proper alignment and technique with fitness routine and running mechanics to minimzie relapse of diastasis recti     Time  12    Period  Weeks    Status  On-going      PT LONG TERM GOAL #4   Title  Pt will report decreased R  knee pain by 50% in order to return to running and fitness    Time  4    Period  Weeks    Status  Partially Met      PT LONG TERM GOAL #5   Title  Pt will demo less adducted patella, genu valgus with running in order to minimize risks for injuries    Time  8    Period  Weeks    Status  Partially Met            Plan - 02/11/18 0908    Clinical Impression Statement  Pt advanced to to running mechanics neuromuscular reeducation to correct genu valgus, supination on landing, and posterior COM. Pt showed improvements post training. Pt also showed improved walking mechanics with more co-activation of abdominal muscles and less lumbar lordosis. Pt required excessive tactile and verbal cues for lower kinetic propioception. Pt is planning to train for a half marathon in April. Pt will return once a month due to finances. Plan to educate pt on cross-training and correcting her genu valgus to miminize risks for injuries. Pt continues to benefit from skilled PT.      Rehab Potential  Good    PT Frequency  1x / week    PT Duration  12 weeks    PT Treatment/Interventions  Therapeutic activities;Functional mobility training;Neuromuscular re-education;Therapeutic exercise;Moist Heat;Aquatic  Therapy;Biofeedback;Balance training;Gait training;Stair training;Traction;Patient/family education;Manual techniques;Electrical Stimulation;Scar mobilization;Manual lymph drainage;Energy conservation;Passive range of motion    Consulted and Agree with Plan of Care  Patient       Patient will benefit from skilled therapeutic intervention in order to improve the following deficits and impairments:  Improper body mechanics, Decreased scar mobility, Decreased safety awareness, Decreased strength, Decreased activity tolerance, Decreased range of motion, Decreased knowledge of use of DME, Pain, Postural dysfunction, Decreased endurance, Decreased balance, Abnormal gait, Decreased mobility, Increased muscle spasms, Hypomobility, Difficulty walking  Visit Diagnosis: Diastasis recti  Other symptoms and signs involving the musculoskeletal system  Muscle weakness (generalized)     Problem List Patient Active Problem List   Diagnosis Date Noted  . KNEE PAIN, LEFT 04/09/2010  . PATELLAR TENDINITIS 04/09/2010    Jerl Mina ,PT, DPT, E-RYT  02/11/2018, 10:34 AM  [place] MAIN [facility] SERVICES [address] [place], [place], [address] Phone: [phone]   Fax:  [phone]  Name: Beverly Ross MRN: [id number] Date of Birth: [birth year]

## 2018-03-16 ENCOUNTER — Ambulatory Visit: Payer: BLUE CROSS/BLUE SHIELD | Attending: Advanced Practice Midwife | Admitting: Physical Therapy

## 2018-03-16 DIAGNOSIS — M6281 Muscle weakness (generalized): Secondary | ICD-10-CM

## 2018-03-16 DIAGNOSIS — M6208 Separation of muscle (nontraumatic), other site: Secondary | ICD-10-CM | POA: Diagnosis present

## 2018-03-16 DIAGNOSIS — R29898 Other symptoms and signs involving the musculoskeletal system: Secondary | ICD-10-CM | POA: Insufficient documentation

## 2018-03-16 NOTE — Therapy (Signed)
[place] MAIN [facility] SERVICES [address] [place], [place], [address] Phone: [phone]   Fax:  [phone]  Physical Therapy Treatment  Patient Details  Name: Beverly Ross MRN: [id number] Date of Birth: [birth year] Referring Provider (PT): Masaake   Encounter Date: 03/16/2018  PT End of Session - 03/16/18 1628    Visit Number  6    Number of Visits  12    PT Start Time  0910    PT Stop Time  1005    PT Time Calculation (min)  55 min    Activity Tolerance  Patient tolerated treatment well    Behavior During Therapy  [facility] for tasks assessed/performed       No past medical history on file.  No past surgical history on file.  There were no vitals filed for this visit.  Subjective Assessment - 03/16/18 0911    Subjective  Pt reported she is picking on her mileage with running in preparation for half marathon. Pt had noticed her hips are stronger and has had no knee aches and pains.  Pt has noticed a relapse with her abdominal muscles. Pt and her dtr had a bad cough and she tried to not strain but she felt her muscles sore from coughing.     Pertinent History  Denied bowel issues. Occasional SUI.      Patient Stated Goals  feel better the abdominal area          [facility] PT Assessment - 03/16/18 0919      Observation/Other Assessments   Observations  simulated yoga poses she performs at home, involved poor foot alignment and poses which placed increased strain/stretch to abdominal wall. Pt able to demo improved modifications to continue co-activating abdominal wall with pelvic floor properly       Strength   Overall Strength Comments  R hip abd 4-/5, L 5/5 , hip ext B 5/5  , posterior sling /hip ext without lumbar perturbation    PF 20 reps L, 4 reps on R w/ LOB                Pelvic Floor Special Questions - 03/16/18 1547    Pelvic Floor Internal Exam  pt consented verbally without contraindications    Exam Type  Vaginal     Palpation  no cues required for decreasing overuse of ab, delayed lengthening initially    Strength  fair squeeze, definite lift   elicited stronger contraction w/ toning        [facility] Adult PT Treatment/Exercise - 03/16/18 1551      Therapeutic Activites    Therapeutic Activities  --   discussed strategies to minimize overloading joints w/ run     Neuro Re-ed    Neuro Re-ed Details   cued for proper alignment technique and modifications to yoga poses to minimize strain to abdominal wall, optimize deep core mm      Exercises   Exercises  --   see pt instructions, strengthening R hip                 PT Long Term Goals - 02/11/18 1031      PT LONG TERM GOAL #1   Title  Pt will demo decreased fingers width separation from 4 fingers with to < 2 fingers width in order to prepare for running     Time  4    Period  Weeks    Status  Achieved  PT LONG TERM GOAL #2   Title  Pt will demo increased L hip flexion strength from 3/5 to > 4 /5 in order to minimize risk for injuries with running    Time  8    Period  Weeks    Status  Achieved      PT LONG TERM GOAL #3   Title  Pt will demo proper alignment and technique with fitness routine and running mechanics to minimzie relapse of diastasis recti     Time  12    Period  Weeks    Status  On-going      PT LONG TERM GOAL #4   Title  Pt will report decreased R knee pain by 50% in order to return to running and fitness    Time  4    Period  Weeks    Status  Partially Met      PT LONG TERM GOAL #5   Title  Pt will demo less adducted patella, genu valgus with running in order to minimize risks for injuries    Time  8    Period  Weeks    Status  Partially Met            Plan - 03/16/18 1557    Clinical Impression Statement Pt showed resolved diastasis recti despite pt feeling relapsed from being sick and coughing in prior weeks. Umbilical hernia does not appear worse and today pt was able to demo increased  abdominal tensigrity with co-activation of deep core during pelvic tilt propioception training.  Pt has continued training for half marathon and has added yoga practice for 1 hour once a wekk but had no additional cross training routine. Today,  simulated yoga poses she performs at home and observed pt with poor foot alignment and poses which placed increased strain/stretch to abdominal wall. Pt able to demo improved modifications to continue co-activating abdominal wall with pelvic floor properly. Anticipate pt's modifications to yoga will help minimize risk of worsening umbilical hernia. Discussed with pt a referral to surgeon when she would like a consult.  Reassessed pelvic floor today which showed improved coordination without cues. Continued with R hip strengthening and dynamic balance strengthening to optimize pt's readiness for her endurance running for her half marathon. Pt continues to benefit from skilled PT.    Rehab Potential  Good    PT Frequency  1x / week    PT Duration  12 weeks    PT Treatment/Interventions  Therapeutic activities;Functional mobility training;Neuromuscular re-education;Therapeutic exercise;Moist Heat;Aquatic Therapy;Biofeedback;Balance training;Gait training;Stair training;Traction;Patient/family education;Manual techniques;Electrical Stimulation;Scar mobilization;Manual lymph drainage;Energy conservation;Passive range of motion    Consulted and Agree with Plan of Care  Patient       Patient will benefit from skilled therapeutic intervention in order to improve the following deficits and impairments:  Improper body mechanics, Decreased scar mobility, Decreased safety awareness, Decreased strength, Decreased activity tolerance, Decreased range of motion, Decreased knowledge of use of DME, Pain, Postural dysfunction, Decreased endurance, Decreased balance, Abnormal gait, Decreased mobility, Increased muscle spasms, Hypomobility, Difficulty walking  Visit  Diagnosis: Diastasis recti     Problem List Patient Active Problem List   Diagnosis Date Noted  . KNEE PAIN, LEFT 04/09/2010  . PATELLAR TENDINITIS 04/09/2010    Jerl Mina ,PT, DPT, E-RYT  03/16/2018, 4:33 PM  [place] MAIN [facility] SERVICES [address] [place], [place], [address] Phone: [phone]   Fax:  [phone]  Name: Beverly Ross MRN: [id number]  Date of Birth: 17-Jan-1981

## 2018-03-16 NOTE — Addendum Note (Signed)
Addended by: Mariane Masters on: 03/16/2018 04:43 PM   Modules accepted: Orders

## 2018-03-16 NOTE — Patient Instructions (Addendum)
Take away clams   Add heel raises  R knee slightly bent ( heel raises 20 reps on R) ( 10 rep on L)   1 x day  Hip abduction ( diagonal) with a slight lean  10 reps on each leg  1 x day   R side plank 5 sec x 5 reps, remember about alignment as learned   Adding on ocean breath while seated and deep core level 1 and 2  ______  Modifications to yoga sun salutations for diastasis    Instead of upward facing dog --> low cobra to maintain abdominal wall integrity   Instead of chatarunga transition to downdog --> drop onto knees/toes tucked for lowering plank, then to rise, stay on knees/toes tucked,  use propped on elbow technique then lift elbows and extend it to move into downdog.   Instead of "flat back" -->  Stay in mini squat, knees slightly bent, push palms against thigh as thighs push forward, shins downward, shoulders away from ears

## 2018-04-26 ENCOUNTER — Ambulatory Visit: Payer: BLUE CROSS/BLUE SHIELD | Admitting: Physical Therapy

## 2018-11-12 ENCOUNTER — Other Ambulatory Visit: Payer: Self-pay

## 2018-11-12 DIAGNOSIS — Z20822 Contact with and (suspected) exposure to covid-19: Secondary | ICD-10-CM

## 2018-11-13 LAB — NOVEL CORONAVIRUS, NAA: SARS-CoV-2, NAA: NOT DETECTED

## 2019-01-31 ENCOUNTER — Other Ambulatory Visit: Payer: BLUE CROSS/BLUE SHIELD

## 2019-06-10 ENCOUNTER — Ambulatory Visit: Payer: BLUE CROSS/BLUE SHIELD | Attending: Advanced Practice Midwife | Admitting: Physical Therapy

## 2019-06-10 ENCOUNTER — Encounter: Payer: Self-pay | Admitting: Physical Therapy

## 2019-06-10 ENCOUNTER — Other Ambulatory Visit: Payer: Self-pay

## 2019-06-10 DIAGNOSIS — M6208 Separation of muscle (nontraumatic), other site: Secondary | ICD-10-CM | POA: Diagnosis present

## 2019-06-10 DIAGNOSIS — R29898 Other symptoms and signs involving the musculoskeletal system: Secondary | ICD-10-CM | POA: Insufficient documentation

## 2019-06-10 DIAGNOSIS — R278 Other lack of coordination: Secondary | ICD-10-CM | POA: Diagnosis present

## 2019-06-10 DIAGNOSIS — M62838 Other muscle spasm: Secondary | ICD-10-CM | POA: Insufficient documentation

## 2019-06-10 DIAGNOSIS — M6281 Muscle weakness (generalized): Secondary | ICD-10-CM | POA: Diagnosis present

## 2019-06-10 NOTE — Therapy (Signed)
[place] MAIN [facility] SERVICES [address] [place], [place], [address] Phone: [phone]   Fax:  [phone]  Physical Therapy Evaluation  Patient Details  Name: Beverly Ross MRN: [id number] Date of Birth: [birth year] Referring Provider (PT): Maaske    Encounter Date: 06/10/2019  PT End of Session - 06/10/19 1051    Visit Number  1    Number of Visits  10    PT Start Time  1003    PT Stop Time  1100    PT Time Calculation (min)  57 min    Activity Tolerance  Patient tolerated treatment well    Behavior During Therapy  [facility] for tasks assessed/performed       History reviewed. No pertinent past medical history.  Past Surgical History:  Procedure Laterality Date  . breast augmentation   2011  . C-sections     two emergency c-sections 2013, 2016  . LIPOSUCTION EXTREMITIES  2011    There were no vitals filed for this visit.   Subjective Assessment - 06/10/19 1013    Subjective Pelvic sensation and urge to urinate:    In August 2020, pt noticed she felt the urge to urinate again after going before going to bed. Pt thought it was a UTI and took preventative stuff but it persisted. Pt felt inside that something is dropping. Pt saw her OB/GYN Oct 2020 and midwife recommended pelvic floor therapy.  This dropping feeling has gotten better and no longer occuring night and now is only occuring 2-3 x a week. Pt was exercising more frequently when the dropping feeling was occurring more frequently. The sensation did not occur with exercise. Exercise routine: running on the treadmill 69miles, 30 min yoga/ week, 30 min arm workout 7 lbs, planks, push ups on knees, free weights.  Lifting/ pushing strooller with 45  lbs sometimes. Urinary incontinence is rare, bowel movemnts are regular and daily. During the day,pt still has the sensation after she just finished urination but it is not as pronounced as before bedtime.    Pertinent History  2 c-sections,  breast augmentation surgeries.    Patient Stated Goals  help figure out thisbladder situation         [facility] PT Assessment - 06/10/19 1052      Assessment   Medical Diagnosis  other Sx/ Signs of digestive and abdomen    Referring Provider (PT)  Maaske       Precautions   Precautions  None      Restrictions   Weight Bearing Restrictions  No      Balance Screen   Has the patient fallen in the past 6 months  Yes   1   How many times?  1   fell off stairs landed on bottom with bruise ,    Has the patient had a decrease in activity level because of a fear of falling?   No    Is the patient reluctant to leave their home because of a fear of falling?   No      Coordination   Gross Motor Movements are Fluid and Coordinated  --   proper diaphramgatic excursion but pelvic floor dyscoordinat     Other:   Other/ Comments [employer] body fitness routine exercises with poor alignment, poor co-activation of LKC, TKC, and poor form. No lumbar lodosis, increased upper trap overuse , decreased scapar depression/ retraction       Palpation   Spinal mobility  R thoracic, L lumbar curve, iliac crest is levelled,   R shoulder lowered                Objective measurements completed on examination: See above findings.    Pelvic Floor Special Questions - 06/10/19 1440    Diastasis Recti  neg    External Perineal Exam  increased L pelvic floor tightness, ab overuse with cue for squeeze       [facility] Adult PT Treatment/Exercise - 06/10/19 1438      Therapeutic Activites    Therapeutic Activities  --   modifications to fitness routine      Neuro Re-ed    Neuro Re-ed Details   cued for less ab overuse with pelvic floor coordination and breathing. cued for proper co-activation of stabilization mm in fitness routine       Manual Therapy   Manual therapy comments  STM/MWM at L pelvic floor                    PT Long Term Goals - 06/10/19 1434      PT LONG TERM GOAL #1    Title  Pt will demo increased FOTO score for Urinary from 62 pt to > 70 pts in order to improve QOL    Time  10    Period  Weeks    Status  New    Target Date  08/19/19      PT LONG TERM GOAL #2   Title  Pt will demo decreased L pelvic floor mm tightness and improved pelvic floor lengthening and coordination without ab overuse in order to optimize IAP system    Time  3    Period  Weeks    Status  New    Target Date  07/01/19      PT LONG TERM GOAL #3   Title  Pt will demo IND with fitness exercises to minimize upper trap overuse, optimize scapulo thoracic system  and pelvic floor function    Time  8    Period  Weeks    Status  New    Target Date  08/05/19      PT LONG TERM GOAL #4   Title  Pt will demo proper pelvic floor coordination with all 3 layers, proper circumferential and sequential contraction in order to provide pelvic organ support when lying down to go to sleep    Time  6    Period  Weeks    Status  New    Target Date  07/22/19             Plan - 06/10/19 1446    Clinical Impression Statement   Pt is a  39  yo  who presents with pelvic dropping sensation and urge to urinate after urinating which occurs mostly at night after she lies down for bed. This started in August of 2020 and at this time, pt was exercising more frequently but the sensation did not occur with exercise.    Pt's musculoskeletal assessment revealed spinal deviations, dyscoordination and strength of pelvic floor mm, poor body mechanics with fitness routine exercises that includes weights and body weight.   These are deficits that indicate an ineffective intraabdominal pressure system associated with increased risk for pt's Sx.    Pt will benefit from coordination training and education on fitness and functional positions in order to gain a more effective intraabdominal pressure system to minimize Sx.  Following Tx today which pt  tolerated without complaints, pt demo'd proper technique with her  [employer] exercises. Modified certain exercises to minimize overuse of upper trap/ pects and to optimize thoracolumbar system. Pt received cues for cued for less ab overuse with pelvic floor coordination and breathing and proper co-activation of stabilization mm.    Pt benefits from skilled PT      Rehab Potential  Good    PT Frequency  1x / week    PT Duration  Other (comment)   10   PT Treatment/Interventions  Therapeutic activities;Functional mobility training;Neuromuscular re-education;Therapeutic exercise;Moist Heat;Aquatic Therapy;Biofeedback;Balance training;Gait training;Stair training;Traction;Patient/family education;Manual techniques;Electrical Stimulation;Scar mobilization;Manual lymph drainage;Energy conservation;Passive range of motion    Consulted and Agree with Plan of Care  Patient       Patient will benefit from skilled therapeutic intervention in order to improve the following deficits and impairments:  Improper body mechanics, Decreased scar mobility, Decreased safety awareness, Decreased strength, Decreased activity tolerance, Decreased range of motion, Decreased knowledge of use of DME, Pain, Postural dysfunction, Decreased endurance, Decreased balance, Abnormal gait, Decreased mobility, Increased muscle spasms, Hypomobility, Difficulty walking  Visit Diagnosis: Other muscle spasm  Other lack of coordination     Problem List Patient Active Problem List   Diagnosis Date Noted  . KNEE PAIN, LEFT 04/09/2010  . PATELLAR TENDINITIS 04/09/2010    Mariane Masters ,PT, DPT, E-RYT  06/10/2019, 2:50 PM  [facility] MAIN [facility] SERVICES [address] [place], [place], [address] Phone: [phone]   Fax:  [phone]  Name: Beverly Ross MRN: [id number] Date of Birth: 08/27/1980

## 2019-06-16 ENCOUNTER — Ambulatory Visit: Payer: BLUE CROSS/BLUE SHIELD | Admitting: Physical Therapy

## 2019-06-22 ENCOUNTER — Other Ambulatory Visit: Payer: Self-pay

## 2019-06-22 ENCOUNTER — Ambulatory Visit: Payer: BLUE CROSS/BLUE SHIELD | Admitting: Physical Therapy

## 2019-06-22 DIAGNOSIS — M6281 Muscle weakness (generalized): Secondary | ICD-10-CM

## 2019-06-22 DIAGNOSIS — M62838 Other muscle spasm: Secondary | ICD-10-CM

## 2019-06-22 DIAGNOSIS — R278 Other lack of coordination: Secondary | ICD-10-CM

## 2019-06-22 DIAGNOSIS — R29898 Other symptoms and signs involving the musculoskeletal system: Secondary | ICD-10-CM

## 2019-06-22 DIAGNOSIS — M6208 Separation of muscle (nontraumatic), other site: Secondary | ICD-10-CM

## 2019-06-22 NOTE — Patient Instructions (Signed)
Abdominal massage upward from L,  R , center to belly button 3 stroke x 3 , pressure is gentle and light with all fingers flat, not using finger tips           Practice proper pelvic floor coordination  Inhale: expand pelvic floor muscles Exhale" "j" scoop, allow pelvic floor to close, lift first before belly sinks   ( not "draw abdominal muscle to spine" or strain with abdominal muscles")   

## 2019-06-22 NOTE — Therapy (Signed)
[place] MAIN [facility] SERVICES [address] [place], [place], [address] Phone: [phone]   Fax:  [phone]  Physical Therapy Treatment  Patient Details  Name: Beverly Ross MRN: [id number] Date of Birth: 04-13-1980 Referring Provider (PT): [facility]    Encounter Date: 06/22/2019  PT End of Session - 06/22/19 0907    Visit Number  2    Number of Visits  10    PT Start Time  0903    PT Stop Time  1000    PT Time Calculation (min)  57 min    Activity Tolerance  Patient tolerated treatment well    Behavior During Therapy  [facility] for tasks assessed/performed       No past medical history on file.  Past Surgical History:  Procedure Laterality Date  . breast augmentation   2011  . C-sections     two emergency c-sections 2013, 2016  . LIPOSUCTION EXTREMITIES  2011    There were no vitals filed for this visit.  Subjective Assessment - 06/22/19 0908    Subjective  Pt reported she practiced the HEP without any issues. The new techniques with push ups were harder on the arms .  Pt feels the bladder is lowered after urination.    Pertinent History  2 c-sections, breast augmentation surgeries.    Patient Stated Goals  help figure out thisbladder situation         [facility] PT Assessment - 06/22/19 0909      Coordination   Gross Motor Movements are Fluid and Coordinated  --   excessive oblique overuse with deep core      Palpation   Spinal mobility  R iliac crest slightly lowered, shoulders more levelled, no spinal deviations                  Pelvic Floor Special Questions - 06/22/19 1053    Pelvic Floor Internal Exam  pt consented without verbal consent and no contraindications     Exam Type  Vaginal    Palpation  slightly lowered bladder behind pubic symphysis, restricted puborectalis, abdominal wall supra pubic , L posterior pelvic floor          [facility] Adult PT Treatment/Exercise - 06/22/19 1034      Therapeutic Activites     Therapeutic Activities  --   self massage over scar      Neuro Re-ed    Neuro Re-ed Details   cued for less oblique overuse with deep core, cued for pelvic floor initation       Modalities   Modalities  Moist Heat      Moist Heat Therapy   Number Minutes Moist Heat  5 Minutes    Moist Heat Location  --   perineum, abdomen      Manual Therapy   Manual therapy comments  fascial mobilization of C-section scars to promote coordinationof deep core     Internal Pelvic Floor  fascial glides to promote upward mobility of pelvic floor                   PT Long Term Goals - 06/10/19 1434      PT LONG TERM GOAL #1   Title  Pt will demo increased FOTO score for Urinary from 62 pt to > 70 pts in order to improve QOL    Time  10    Period  Weeks    Status  New    Target  Date  08/19/19      PT LONG TERM GOAL #2   Title  Pt will demo decreased L pelvic floor mm tightness and improved pelvic floor lengthening and coordination without ab overuse in order to optimize IAP system    Time  3    Period  Weeks    Status  New    Target Date  07/01/19      PT LONG TERM GOAL #3   Title  Pt will demo IND with fitness exercises to minimize upper trap overuse, optimize scapulo thoracic system  and pelvic floor function    Time  8    Period  Weeks    Status  New    Target Date  08/05/19      PT LONG TERM GOAL #4   Title  Pt will demo proper pelvic floor coordination with all 3 layers, proper circumferential and sequential contraction in order to provide pelvic organ support when lying down to go to sleep    Time  6    Period  Weeks    Status  New    Target Date  07/22/19            Plan - 06/22/19 0907    Clinical Impression Statement  Pt demo'd lowered position of bladder behind pubic symphysis with overuse of oblique mm which causes dyscoordination of pelvic floor mm and downward movement of organs. Pt tolerated internal Tx to facilitate more fascial mobility over C-section  scars and upward movement of pelvic floor and bladder. Pt required cues to minimizie overuse of oblique mm with deep core coordination and demo'd more upward movement of pelvic floor post training. Pt continues to benefit from skilled PT   Rehab Potential  Good    PT Frequency  1x / week    PT Duration  Other (comment)   10   PT Treatment/Interventions  Therapeutic activities;Functional mobility training;Neuromuscular re-education;Therapeutic exercise;Moist Heat;Aquatic Therapy;Biofeedback;Balance training;Gait training;Stair training;Traction;Patient/family education;Manual techniques;Electrical Stimulation;Scar mobilization;Manual lymph drainage;Energy conservation;Passive range of motion    Consulted and Agree with Plan of Care  Patient       Patient will benefit from skilled therapeutic intervention in order to improve the following deficits and impairments:  Improper body mechanics, Decreased scar mobility, Decreased safety awareness, Decreased strength, Decreased activity tolerance, Decreased range of motion, Decreased knowledge of use of DME, Pain, Postural dysfunction, Decreased endurance, Decreased balance, Abnormal gait, Decreased mobility, Increased muscle spasms, Hypomobility, Difficulty walking  Visit Diagnosis: Other muscle spasm  Other lack of coordination  Diastasis recti  Other symptoms and signs involving the musculoskeletal system  Muscle weakness (generalized)     Problem List Patient Active Problem List   Diagnosis Date Noted  . KNEE PAIN, LEFT 04/09/2010  . PATELLAR TENDINITIS 04/09/2010    Mariane Masters ,PT, DPT, E-RYT  06/22/2019, 10:56 AM  [place] [facility] MAIN [facility] SERVICES [address] [place], [place], [address] Phone: [phone]   Fax:  [phone]  Name: Beverly Ross MRN: [id number] Date of Birth: January 13, 1981

## 2019-06-30 ENCOUNTER — Ambulatory Visit: Payer: BLUE CROSS/BLUE SHIELD | Admitting: Physical Therapy

## 2019-07-07 ENCOUNTER — Ambulatory Visit: Payer: BLUE CROSS/BLUE SHIELD | Attending: Advanced Practice Midwife | Admitting: Physical Therapy

## 2019-07-07 ENCOUNTER — Other Ambulatory Visit: Payer: Self-pay

## 2019-07-07 DIAGNOSIS — M62838 Other muscle spasm: Secondary | ICD-10-CM

## 2019-07-07 DIAGNOSIS — R278 Other lack of coordination: Secondary | ICD-10-CM | POA: Insufficient documentation

## 2019-07-07 NOTE — Patient Instructions (Signed)
Instead of the dumbbells over head:   Oblique/ scapula stabilization   Opposite arm   Place band in "U"    band under ballmounds  while laying on back w/ knees bent     20 reps  on each side  Holding band from opposite thigh,  Inhale,    exhale then pull band across body while keeping elbow , shoulders, back of the head pressed down     ___  Deep core level 1 ( breathing)  Deep core level 2 ( 6 min) single knee out 30 deg without wobbling opposite hips Do not do the lifting foot, marching version   ___

## 2019-07-08 NOTE — Therapy (Addendum)
[place] MAIN [facility] SERVICES [address] [place], [place], [address] Phone: [phone]   Fax:  [phone]  Physical Therapy Treatment /Discharge Summary   Patient Details  Name: Beverly Ross MRN: [id number] Date of Birth: 1980/12/26 Referring Provider (PT): Maaske    Encounter Date: 07/07/2019  PT End of Session - 07/07/19 1122    Visit Number  3    Number of Visits  10    PT Start Time  1000    PT Stop Time  1100    PT Time Calculation (min)  60 min    Activity Tolerance  Patient tolerated treatment well    Behavior During Therapy  [facility] for tasks assessed/performed       No past medical history on file.  Past Surgical History:  Procedure Laterality Date  . breast augmentation   2011  . C-sections     two emergency c-sections 2013, 2016  . LIPOSUCTION EXTREMITIES  2011    There were no vitals filed for this visit.  Subjective Assessment - 07/07/19 1004    Subjective  Pt reports the bladder sensation being lowered occured more when she was exercising more ( running 3 x )  and currently she has not been exercising as much( running 1 x week).  The bladder sensation is occuring across some points during the day every day and no longer occuring every night, only 2 days/ week.    Pertinent History  2 c-sections, breast augmentation surgeries.    Patient Stated Goals  help figure out thisbladder situation                     Pelvic Floor Special Questions - 07/07/19 1017    Diastasis Recti  2 fingers width above umbilicus     External Perineal Exam  abdominal scar restrictions     Pelvic Floor Internal Exam  pt consented without verbal consent and no contraindications     Exam Type  Vaginal        [facility] Adult PT Treatment/Exercise - 07/07/19 1017      Manual Therapy   Manual therapy comments  quadriped position with abdominal fascial pulling     Kinesiotex  Facilitate Muscle   DRA closure                  PT Long Term Goals - 07/08/19 1310      PT LONG TERM GOAL #1   Title  Pt will demo increased FOTO score for Urinary from 62 pt to > 70 pts in order to improve QOL    Time  10    Period  Weeks    Status  On-going      PT LONG TERM GOAL #2   Title  Pt will demo decreased L pelvic floor mm tightness and improved pelvic floor lengthening and coordination without ab overuse in order to optimize IAP system    Time  3    Period  Weeks    Status  Achieved      PT LONG TERM GOAL #3   Title  Pt will demo IND with fitness exercises to minimize upper trap overuse, optimize scapulo thoracic system  and pelvic floor function    Time  8    Period  Weeks    Status  Achieved      PT LONG TERM GOAL #4   Title  Pt will demo proper pelvic floor coordination with all 3  layers, proper circumferential and sequential contraction in order to provide pelvic organ support when lying down to go to sleep    Time  6    Period  Weeks    Status  Achieved            Plan - 07/08/19 1255    Clinical Impression Statement  Pt    Rehab Potential  Good    PT Frequency  1x / week    PT Duration  Other (comment)   10   PT Treatment/Interventions  Therapeutic activities;Functional mobility training;Neuromuscular re-education;Therapeutic exercise;Moist Heat;Aquatic Therapy;Biofeedback;Balance training;Gait training;Stair training;Traction;Patient/family education;Manual techniques;Electrical Stimulation;Scar mobilization;Manual lymph drainage;Energy conservation;Passive range of motion    Consulted and Agree with Plan of Care  Patient       Patient will benefit from skilled therapeutic intervention in order to improve the following deficits and impairments:  Improper body mechanics, Decreased scar mobility, Decreased safety awareness, Decreased strength, Decreased activity tolerance, Decreased range of motion, Decreased knowledge of use of DME, Pain, Postural dysfunction, Decreased  endurance, Decreased balance, Abnormal gait, Decreased mobility, Increased muscle spasms, Hypomobility, Difficulty walking  Visit Diagnosis: Other muscle spasm  Other lack of coordination     Problem List Patient Active Problem List   Diagnosis Date Noted  . KNEE PAIN, LEFT 04/09/2010  . PATELLAR TENDINITIS 04/09/2010    Mariane Masters ,PT, DPT, E-RYT  07/08/2019, 1:11 PM  [place] [facility] MAIN [facility] SERVICES [address] [place], [place], [address] Phone: [phone]   Fax:  [phone]  Name: Beverly Ross MRN: [id number] Date of Birth: 09-15-1980

## 2019-07-14 ENCOUNTER — Ambulatory Visit: Payer: BLUE CROSS/BLUE SHIELD | Admitting: Physical Therapy

## 2019-07-21 ENCOUNTER — Ambulatory Visit: Payer: BLUE CROSS/BLUE SHIELD | Admitting: Physical Therapy

## 2019-07-28 ENCOUNTER — Ambulatory Visit: Payer: BLUE CROSS/BLUE SHIELD | Admitting: Physical Therapy

## 2019-08-04 ENCOUNTER — Ambulatory Visit: Payer: BLUE CROSS/BLUE SHIELD | Admitting: Physical Therapy

## 2019-08-11 ENCOUNTER — Ambulatory Visit: Payer: BLUE CROSS/BLUE SHIELD | Admitting: Physical Therapy

## 2019-08-23 ENCOUNTER — Emergency Department: Payer: BLUE CROSS/BLUE SHIELD

## 2019-08-23 ENCOUNTER — Encounter: Payer: Self-pay | Admitting: Emergency Medicine

## 2019-08-23 ENCOUNTER — Other Ambulatory Visit: Payer: Self-pay

## 2019-08-23 ENCOUNTER — Emergency Department
Admission: EM | Admit: 2019-08-23 | Discharge: 2019-08-23 | Disposition: A | Discharge status: Home / Self Care | Payer: BLUE CROSS/BLUE SHIELD | Attending: Emergency Medicine | Admitting: Emergency Medicine

## 2019-08-23 DIAGNOSIS — R071 Chest pain on breathing: Secondary | ICD-10-CM | POA: Diagnosis not present

## 2019-08-23 DIAGNOSIS — R079 Chest pain, unspecified: Secondary | ICD-10-CM | POA: Diagnosis not present

## 2019-08-23 DIAGNOSIS — Z20822 Contact with and (suspected) exposure to covid-19: Secondary | ICD-10-CM | POA: Diagnosis not present

## 2019-08-23 DIAGNOSIS — R0602 Shortness of breath: Secondary | ICD-10-CM | POA: Diagnosis not present

## 2019-08-23 DIAGNOSIS — Z79899 Other long term (current) drug therapy: Secondary | ICD-10-CM | POA: Diagnosis not present

## 2019-08-23 LAB — BASIC METABOLIC PANEL
Anion gap: 4 — ABNORMAL LOW (ref 5–15)
BUN: 9 mg/dL (ref 6–20)
CO2: 30 mmol/L (ref 22–32)
Calcium: 8.2 mg/dL — ABNORMAL LOW (ref 8.9–10.3)
Chloride: 107 mmol/L (ref 98–111)
Creatinine, Ser: 0.68 mg/dL (ref 0.44–1.00)
GFR calc Af Amer: >60 mL/min (ref >60)
GFR calc non Af Amer: >60 mL/min (ref >60)
Glucose, Bld: 96 mg/dL (ref 70–99)
Potassium: 3.7 mmol/L (ref 3.5–5.1)
Sodium: 141 mmol/L (ref 135–145)

## 2019-08-23 LAB — HEPATIC FUNCTION PANEL
ALT: 25 U/L (ref 0–44)
AST: 28 U/L (ref 15–41)
Albumin: 3.6 g/dL (ref 3.5–5.0)
Alkaline Phosphatase: 58 U/L (ref 38–126)
Bilirubin, Direct: <0.1 mg/dL (ref 0.0–0.2)
Total Bilirubin: 0.4 mg/dL (ref 0.3–1.2)
Total Protein: 6.3 g/dL — ABNORMAL LOW (ref 6.5–8.1)

## 2019-08-23 LAB — CBC
HCT: 36.9 % (ref 36.0–46.0)
Hemoglobin: 11.9 g/dL — ABNORMAL LOW (ref 12.0–15.0)
MCH: 30.8 pg (ref 26.0–34.0)
MCHC: 32.2 g/dL (ref 30.0–36.0)
MCV: 95.6 fL (ref 80.0–100.0)
Platelets: 259 10*3/uL (ref 150–400)
RBC: 3.86 MIL/uL — ABNORMAL LOW (ref 3.87–5.11)
RDW: 13 % (ref 11.5–15.5)
WBC: 4.8 10*3/uL (ref 4.0–10.5)
nRBC: 0 % (ref 0.0–0.2)

## 2019-08-23 LAB — TROPONIN I (HIGH SENSITIVITY)
Troponin I (High Sensitivity): 3 ng/L (ref <18)
Troponin I (High Sensitivity): 2 ng/L (ref <18)

## 2019-08-23 LAB — FIBRIN DERIVATIVES D-DIMER (ARMC ONLY): Fibrin derivatives D-dimer (ARMC): 279.07 ng/mL (FEU) (ref 0.00–499.00)

## 2019-08-23 LAB — LIPASE, BLOOD: Lipase: 34 U/L (ref 11–51)

## 2019-08-23 LAB — BRAIN NATRIURETIC PEPTIDE: B Natriuretic Peptide: 56.7 pg/mL (ref 0.0–100.0)

## 2019-08-23 LAB — SARS CORONAVIRUS 2 BY RT PCR (HOSPITAL ORDER, PERFORMED IN ~~LOC~~ HOSPITAL LAB): SARS Coronavirus 2: NEGATIVE

## 2019-08-23 MED ORDER — FAMOTIDINE IN NACL 20-0.9 MG/50ML-% IV SOLN
20.0000 mg | Freq: Once | INTRAVENOUS | Status: AC
  Administered 2019-08-23: 20 mg via INTRAVENOUS
  Filled 2019-08-23: qty 50

## 2019-08-23 MED ORDER — SODIUM CHLORIDE 0.9% FLUSH: 3.0000 mL | Freq: Once | INTRAVENOUS | Status: DC

## 2019-08-23 MED ORDER — ALBUTEROL SULFATE HFA 108 (90 BASE) MCG/ACT IN AERS
2.0000 | INHALATION_SPRAY | RESPIRATORY_TRACT | 0 refills | Status: DC | PRN
Start: 2019-08-23 — End: 2021-02-17

## 2019-08-23 MED ORDER — ALBUTEROL SULFATE (2.5 MG/3ML) 0.083% IN NEBU
2.5000 mg | INHALATION_SOLUTION | Freq: Once | RESPIRATORY_TRACT | Status: AC
  Administered 2019-08-23: 2.5 mg via RESPIRATORY_TRACT
  Filled 2019-08-23: qty 3

## 2019-08-23 NOTE — Discharge Instructions (Addendum)
1.  You may use Albuterol inhaler 2 puffs every 4 hours as needed for breathing difficulty. 2.  Return to the ER for worsening symptoms, persistent vomiting, difficulty breathing or other concerns.

## 2019-08-23 NOTE — ED Provider Notes (Signed)
[facility] Emergency Department Provider Note   ____________________________________________   First MD Initiated Contact with Patient 08/23/19 [phone]     (approximate)  I have reviewed the triage vital signs and the nursing notes.   HISTORY  Chief Complaint Chest Pain    HPI ZYKIA WALLA is a 39 y.o. female brought to the ED via EMS from home with a chief complaint of chest pain.  Patient awoke approximately 2 AM with midsternal chest tightness.  Reports pain worsened on sitting up with difficulty breathing.  Also worsened on deep breathing.  Ate [place] food for dinner.  Received aspirin and nitroglycerin spray per EMS without relief of symptoms.  Recent 3-hour drive to and from town over 2 days time last week.  Denies fever, cough, abdominal pain, nausea, vomiting, dizziness.  Has not had COVID-19 vaccinations.      Past medical history None   Patient Active Problem List   Diagnosis Date Noted  . KNEE PAIN, LEFT 04/09/2010  . PATELLAR TENDINITIS 04/09/2010    Past Surgical History:  Procedure Laterality Date  . breast augmentation   2011  . C-sections     two emergency c-sections 2013, 2016  . LIPOSUCTION EXTREMITIES  2011    Prior to Admission medications   Medication Sig Start Date End Date Taking? Authorizing Provider  cephALEXin (KEFLEX) 500 MG capsule Take 1 capsule (500 mg total) by mouth 2 (two) times daily. Patient not taking: Reported on 06/10/2019 12/28/17   Tommie Sams, DO  cholecalciferol (VITAMIN D3) 25 MCG (1000 UNIT) tablet Take 1,000 Units by mouth daily.    [provider]  fluticasone (FLONASE) 50 MCG/ACT nasal spray Place into both nostrils daily.    [provider]  Multiple Vitamin (MULTIVITAMIN) tablet Take 1 tablet by mouth daily.    [provider]  nitrofurantoin, macrocrystal-monohydrate, (MACROBID) 100 MG capsule Take 1 capsule (100 mg total) by mouth 2 (two) times daily. Patient not  taking: Reported on 06/10/2019 01/10/18   Payton Mccallum, MD    Allergies Sulfonamide derivatives  Family History  Problem Relation Age of Onset  . Healthy Mother   . Stroke Father     Social History Social History   Tobacco Use  . Smoking status: Never Smoker  . Smokeless tobacco: Never Used  Vaping Use  . Vaping Use: Never used  Substance Use Topics  . Alcohol use: Yes  . Drug use: No    Review of Systems  Constitutional: No fever/chills Eyes: No visual changes. ENT: No sore throat. Cardiovascular: Denies chest pain. Respiratory: Denies shortness of breath. Gastrointestinal: No abdominal pain.  No nausea, no vomiting.  No diarrhea.  No constipation. Genitourinary: Negative for dysuria. Musculoskeletal: Negative for back pain. Skin: Negative for rash. Neurological: Negative for headaches, focal weakness or numbness.   ____________________________________________   PHYSICAL EXAM:  VITAL SIGNS: ED Triage Vitals  Enc Vitals Group     BP 08/23/19 0356 104/63     Pulse Rate 08/23/19 0356 62     Resp 08/23/19 0356 18     Temp 08/23/19 0356 98.5 F (36.9 C)     Temp Source 08/23/19 0356 Oral     SpO2 08/23/19 0356 99 %     Weight 08/23/19 0346 145 lb (65.8 kg)     Height 08/23/19 0346 5\' 8"  (1.727 m)     Head Circumference --      Peak Flow --      Pain Score 08/23/19  0346 2     Pain Loc --      Pain Edu? --      Excl. in GC? --     Constitutional: Alert and oriented. Well appearing and in no acute distress. Eyes: Conjunctivae are normal. PERRL. EOMI. Head: Atraumatic. Nose: No congestion/rhinnorhea. Mouth/Throat: Mucous membranes are moist.   Neck: No stridor.   Cardiovascular: Normal rate, regular rhythm. Grossly normal heart sounds.  Good peripheral circulation. Respiratory: Normal respiratory effort.  No retractions. Lungs slightly diminished bibasilarly but otherwise CTAB. Gastrointestinal: Soft and nontender to light or deep palpation. No  distention. No abdominal bruits. No CVA tenderness. Musculoskeletal: No lower extremity tenderness nor edema.  No joint effusions. Neurologic:  Normal speech and language. No gross focal neurologic deficits are appreciated. No gait instability. Skin:  Skin is warm, dry and intact. No rash noted. Psychiatric: Mood and affect are normal. Speech and behavior are normal.  ____________________________________________   LABS (all labs ordered are listed, but only abnormal results are displayed)  Labs Reviewed  BASIC METABOLIC PANEL - Abnormal; Notable for the following components:      Result Value   Calcium 8.2 (*)    Anion gap 4 (*)    All other components within normal limits  CBC - Abnormal; Notable for the following components:   RBC 3.86 (*)    Hemoglobin 11.9 (*)    All other components within normal limits  SARS CORONAVIRUS 2 BY RT PCR (HOSPITAL ORDER, PERFORMED IN [facility])  HEPATIC FUNCTION PANEL  LIPASE, BLOOD  FIBRIN DERIVATIVES D-DIMER ([facility] ONLY)  BRAIN NATRIURETIC PEPTIDE  POC URINE PREG, ED  TROPONIN I (HIGH SENSITIVITY)  TROPONIN I (HIGH SENSITIVITY)   ____________________________________________  EKG  ED ECG REPORT I, Meela Wareing J, the attending physician, personally viewed and interpreted this ECG.   Date: 08/23/2019  EKG Time: 0359  Rate: 59  Rhythm: sinus bradycardia  Axis: Normal  Intervals:none  ST&T Change: Nonspecific  ____________________________________________  RADIOLOGY  ED MD interpretation: No acute cardiopulmonary process  Official radiology report(s): DG Chest 2 View  Result Date: 08/23/2019 CLINICAL DATA:  Chest pain. EXAM: CHEST - 2 VIEW COMPARISON:  None. FINDINGS: The cardiomediastinal silhouette is within normal limits. The lungs are well inflated and clear. There is no evidence of pleural effusion or pneumothorax. There is mild S-shaped thoracolumbar scoliosis. IMPRESSION: No active cardiopulmonary disease.  Electronically Signed   By: Sebastian Ache M.D.   On: 08/23/2019 05:12    ____________________________________________   PROCEDURES  Procedure(s) performed (including Critical Care):  Procedures   ____________________________________________   INITIAL IMPRESSION / ASSESSMENT AND PLAN / ED COURSE  As part of my medical decision making, I reviewed the following data within the electronic MEDICAL RECORD NUMBER Nursing notes reviewed and incorporated, Labs reviewed, EKG interpreted, Old chart reviewed, Radiograph reviewed and Notes from prior ED visits     BREANDA GREENLAW was evaluated in Emergency Department on 08/23/2019 for the symptoms described in the history of present illness. She was evaluated in the context of the global COVID-19 pandemic, which necessitated consideration that the patient might be at risk for infection with the SARS-CoV-2 virus that causes COVID-19. Institutional protocols and algorithms that pertain to the evaluation of patients at risk for COVID-19 are in a state of rapid change based on information released by regulatory bodies including the CDC and federal and state organizations. These policies and algorithms were followed during the patient's care in the ED.    39 year old  female presenting with chest pain. Differential diagnosis includes, but is not limited to, ACS, aortic dissection, pulmonary embolism, cardiac tamponade, pneumothorax, pneumonia, pericarditis, myocarditis, GI-related causes including esophagitis/gastritis, and musculoskeletal chest wall pain.    Initial EKG and troponin unremarkable.  Will add LFTs/lipase, D-dimer and repeat troponin.  Check COVID-19 swab.  Pain currently much improved.  Will administer IV Pepcid and albuterol nebulizer.  Will reassess.  Clinical Course as of Aug 22 700  Tue Aug 23, 2019  2423 Patient improved after nebulizer treatment.  Updated her on repeat troponin and LFTs.  Awaiting results of D-dimer and Covid swab.  If  negative, anticipate discharge home with cardiology follow-up and albuterol inhaler.  Care transferred to Dr. Mayford Knife at change of shift.   [JS]    Clinical Course User Index [JS] Irean Hong, MD     ____________________________________________   FINAL CLINICAL IMPRESSION(S) / ED DIAGNOSES  Final diagnoses:  Nonspecific chest pain     ED Discharge Orders    None       Note:  This document was prepared using Dragon voice recognition software and may include unintentional dictation errors.   Irean Hong, MD 08/23/19 [phone]

## 2019-08-23 NOTE — ED Triage Notes (Addendum)
Pt presents to ED via EMS from home with sudden onset of mid sternal chest pain that woke her up from sleep. Pt reports upon sitting up pt became diaphoretic with difficulty breathing due to pressure in chest. Pt skin now warm and dry and pt reports that currently her chest feels heavy and tight. Pt currently has no increased work of breathing or acute distress noted.  EMS report pt VS 110/64 with HR 60. O2 98% on room air with a FSBS of 113. Pt received 1 spray of nitro and 324 of aspirin prior to arrival. Pt reports very little decrease in pain.

## 2019-08-23 NOTE — ED Provider Notes (Signed)
Work-up has been unremarkable, she is cleared for outpatient follow-up.   Emily Filbert, MD 08/23/19 [phone]

## 2019-08-23 NOTE — ED Notes (Addendum)
Xray returned pt to lobby and requested this nurse assess pt IV. They report blood had backed up the iv tubing towards the NS bag while xray was performed. This nurse removed IV tubing and attmpted to flush IV but was unsuccessful. Extension removed and clot was noted in tube. Removed 22ml of blood from IV prior to placing new extension on IV. re-attempting to flush IV successful. pt denies pain. No swelling or redness to site.

## 2020-08-07 IMAGING — CR DG CHEST 2V
2 series · 2 of 2 positions shown · non-contrast
Comparison: None.

CLINICAL DATA: Chest pain.

EXAM:
CHEST - 2 VIEW

[chest pa]
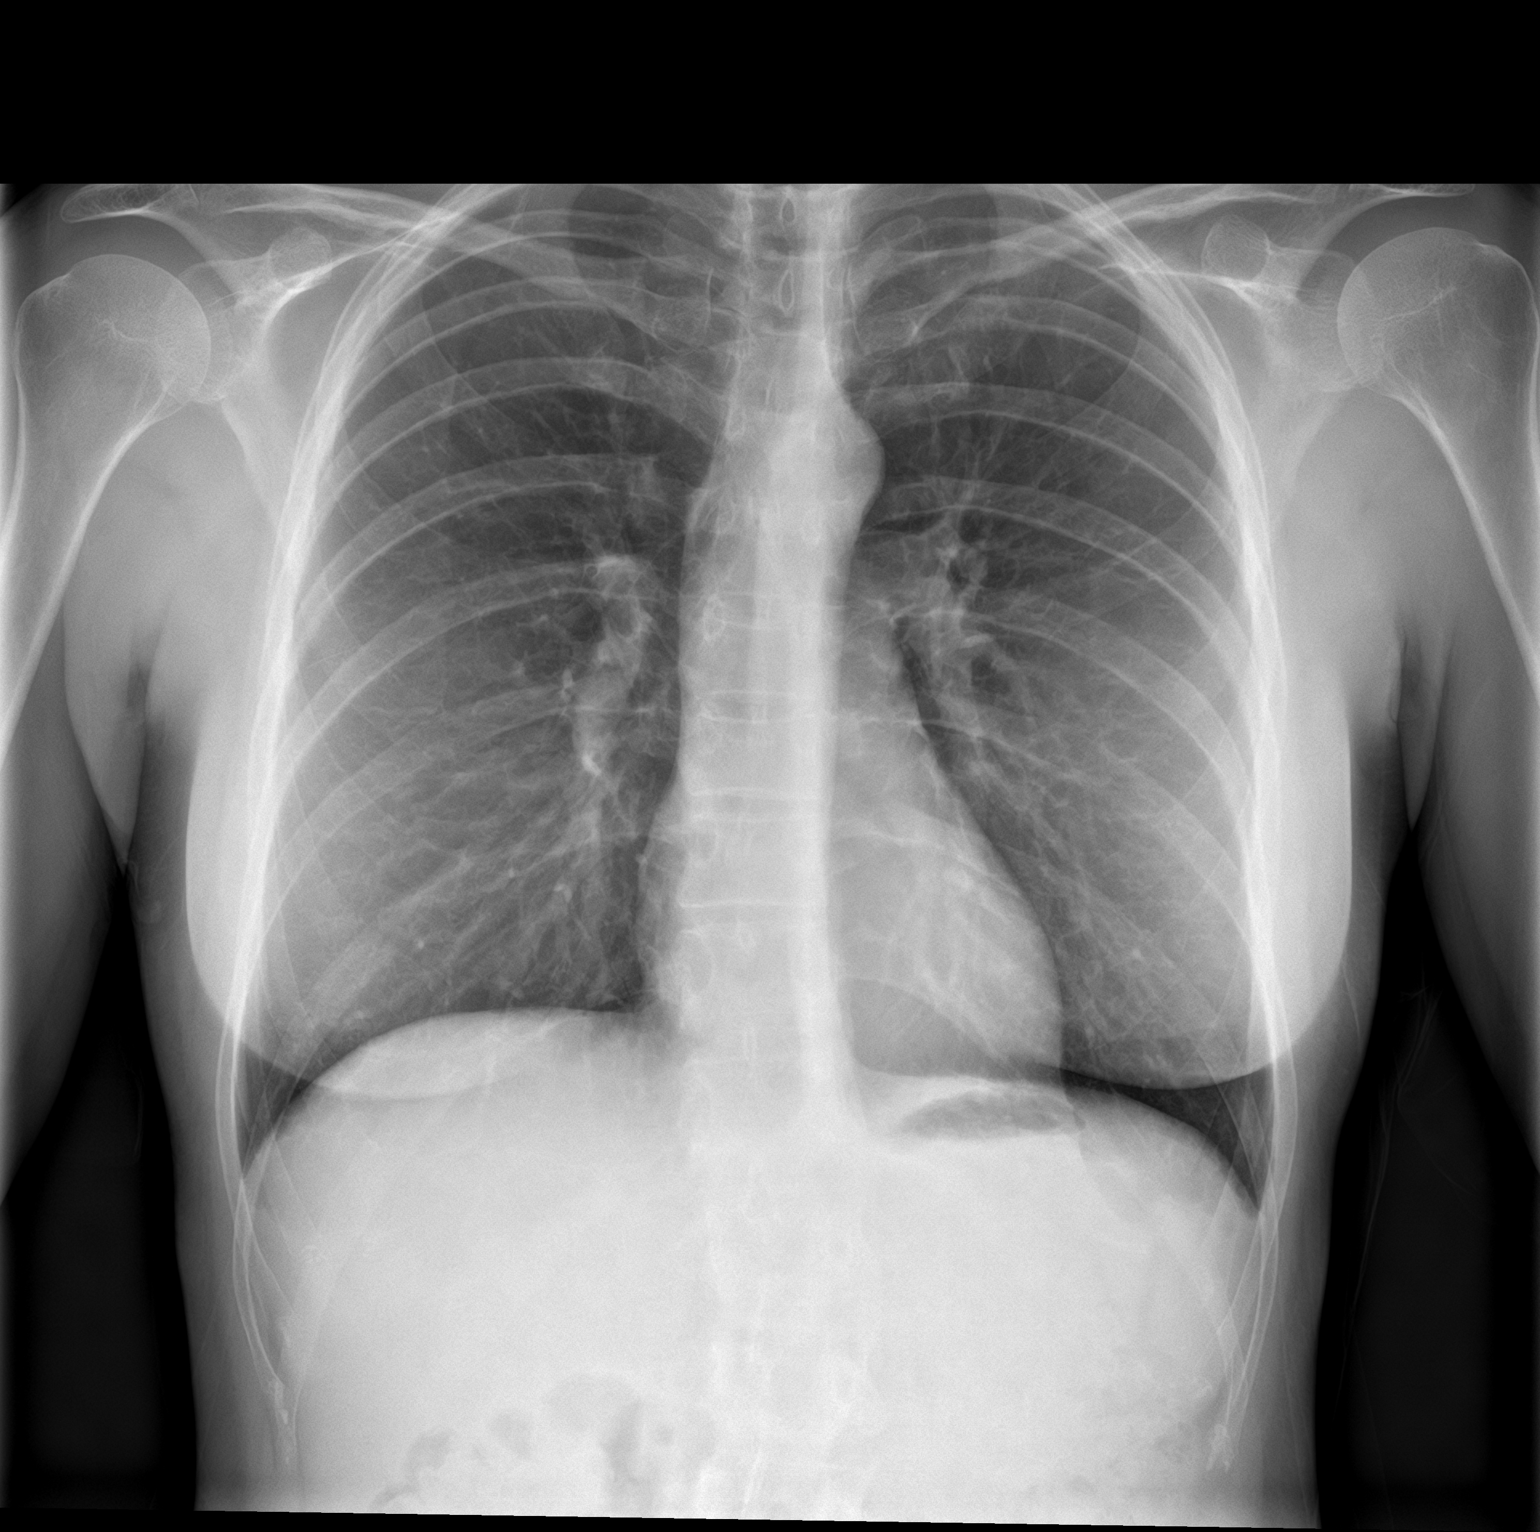

[chest lat]
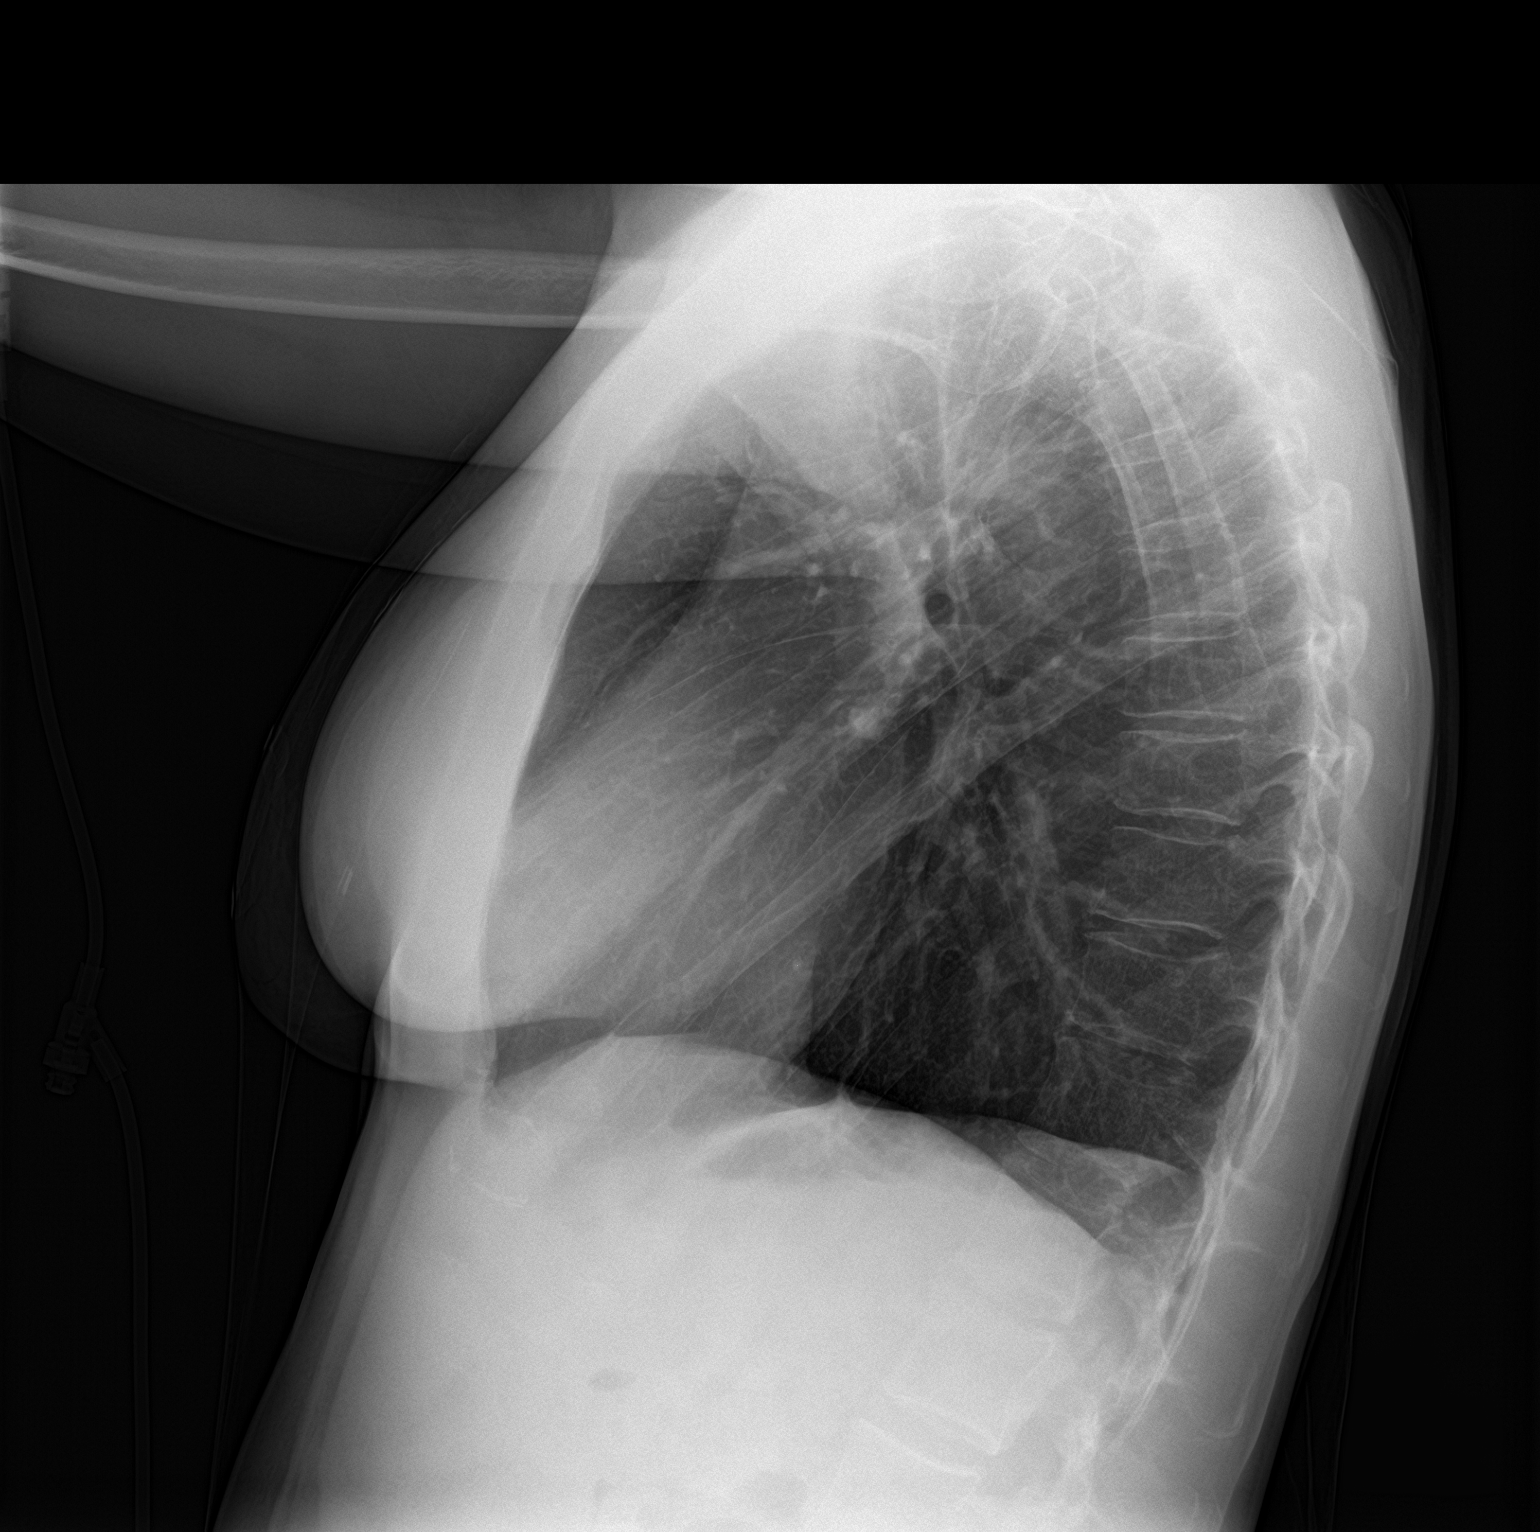

[2 of 2 positions shown; findings below may reference images not displayed]

FINDINGS: The cardiomediastinal silhouette is within normal limits. The lungs
are well inflated and clear. There is no evidence of pleural
effusion or pneumothorax. There is mild S-shaped thoracolumbar
scoliosis.
IMPRESSION: No active cardiopulmonary disease.

## 2020-10-05 ENCOUNTER — Ambulatory Visit
Admission: EM | Admit: 2020-10-05 | Discharge: 2020-10-05 | Disposition: A | Discharge status: Home / Self Care | Payer: BC Managed Care – PPO

## 2020-10-05 ENCOUNTER — Other Ambulatory Visit: Payer: Self-pay

## 2020-10-05 DIAGNOSIS — R197 Diarrhea, unspecified: Secondary | ICD-10-CM

## 2020-10-05 NOTE — Discharge Instructions (Addendum)
-  Symptoms most likely viral in nature.  Regular heart rate is indicative of normal fluid levels.  Antibiotics generally not prescribed without any known cause as different bacteria respond to different antibiotics.  Also, want to avoid increasing resistance. -Can add over-the-counter probiotics to help improve normal GI flora. -Continue to push fluids -Information attached with food choices that may help with the diarrhea. -Can continue with Imodium but on a more scheduled basis to help with symptoms. -Can return with the GI study sample to to help detect possible cause. -Return to clinic or follow-up with PCP should symptoms worsen or not improve.

## 2020-10-05 NOTE — ED Provider Notes (Signed)
[facility]    CSN: [id number] Arrival date & time: 10/05/20  0807      History   Chief Complaint Chief Complaint  Patient presents with   Diarrhea    HPI Beverly Ross is a 40 y.o. female.   Patient is a 40 year old female who presents with chief complaint of diarrhea that started this past Sunday, today is Friday.  Patient denies any recent trips and also states her husband now has similar symptoms.  Patient states last Sunday she felt heavy, tired, had a headache, and states the diarrhea started that evening Monday she felt better but still tired and slept most the day but continued to have diarrhea daily she felt better but still had diarrhea and Wednesday the diarrhea seem to have resolved.  However she states the diarrhea resumed Wednesday night.  She reports she is still been hungry has an appetite.  States the diarrhea seem to be worse late afternoon and evening.  She been taking some Imodium has helped some.  Patient denies any nausea, vomiting, fever, or chills.  She states her son had diarrhea at the beginning of last week for 3 days with a fever visit anything since.  She states her symptoms started 5 days after his ended.  She reports the diarrhea has been watery with no blood.  No mucus.  She reports has had 8-10 episodes of diarrhea in the last 24 hours.  Patient denies any abdominal pain but does report some gassy bloated feeling yesterday.  Patient reports allergies to sulfa medications.   History reviewed. No pertinent past medical history.  Patient Active Problem List   Diagnosis Date Noted   KNEE PAIN, LEFT 04/09/2010   PATELLAR TENDINITIS 04/09/2010    Past Surgical History:  Procedure Laterality Date   breast augmentation   2011   C-sections     two emergency c-sections 2013, 2016   LIPOSUCTION EXTREMITIES  2011    OB History   No obstetric history on file.      Home Medications    Prior to Admission medications   Medication Sig Start  Date End Date Taking? Authorizing Provider  albuterol (VENTOLIN HFA) 108 (90 Base) MCG/ACT inhaler Inhale 2 puffs into the lungs every 4 (four) hours as needed for wheezing or shortness of breath. 08/23/19   Paulette Blanch, MD  cholecalciferol (VITAMIN D3) 25 MCG (1000 UNIT) tablet Take 1,000 Units by mouth daily.    [provider]  fluticasone (FLONASE) 50 MCG/ACT nasal spray Place into both nostrils daily.    [provider]  Multiple Vitamin (MULTIVITAMIN) tablet Take 1 tablet by mouth daily.    [provider]    Family History Family History  Problem Relation Age of Onset   Healthy Mother    Stroke Father     Social History Social History   Tobacco Use   Smoking status: Never   Smokeless tobacco: Never  Vaping Use   Vaping Use: Never used  Substance Use Topics   Alcohol use: Yes   Drug use: No     Allergies   Sulfa antibiotics and Sulfonamide derivatives   Review of Systems Review of Systems as noted in HPI.  Other systems reviewed and found to be negative   Physical Exam Triage Vital Signs ED Triage Vitals  Enc Vitals Group     BP 10/05/20 0822 105/74     Pulse Rate 10/05/20 0822 62     Resp 10/05/20 0822 18  Temp 10/05/20 0822 98.3 F (36.8 C)     Temp Source 10/05/20 0822 Oral     SpO2 10/05/20 0822 99 %     Weight 10/05/20 0820 145 lb (65.8 kg)     Height 10/05/20 0820 5' 8"  (1.727 m)     Head Circumference --      Peak Flow --      Pain Score 10/05/20 0819 0     Pain Loc --      Pain Edu? --      Excl. in [place]? --    No data found.  Updated Vital Signs BP 105/74 (BP Location: Left Arm)   Pulse 62   Temp 98.3 F (36.8 C) (Oral)   Resp 18   Ht 5' 8"  (1.727 m)   Wt 145 lb (65.8 kg)   LMP 09/01/2020 (Approximate)   SpO2 99%   BMI 22.05 kg/m    Physical Exam Constitutional:      General: She is not in acute distress.    Appearance: Normal appearance. She is normal weight. She is not ill-appearing.   Cardiovascular:     Rate and Rhythm: Normal rate and regular rhythm.     Pulses: Normal pulses.     Heart sounds: Normal heart sounds. No murmur heard. Pulmonary:     Effort: No respiratory distress.     Breath sounds: Normal breath sounds. No wheezing.  Abdominal:     General: Abdomen is flat. Bowel sounds are normal. There is no distension.     Palpations: Abdomen is soft.     Tenderness: There is no abdominal tenderness. There is no guarding.  Skin:    General: Skin is warm and dry.     Capillary Refill: Capillary refill takes less than 2 seconds.  Neurological:     General: No focal deficit present.     Mental Status: She is alert and oriented to person, place, and time.     Cranial Nerves: No cranial nerve deficit.     UC Treatments / Results  Labs (all labs ordered are listed, but only abnormal results are displayed) Labs Reviewed  GASTROINTESTINAL PANEL BY PCR, STOOL (REPLACES STOOL CULTURE)    EKG   Radiology No results found.  Procedures Procedures (including critical care time)  Medications Ordered in UC Medications - No data to display  Initial Impression / Assessment and Plan / UC Course  I have reviewed the triage vital signs and the nursing notes.  Pertinent labs & imaging results that were available during my care of the patient were reviewed by me and considered in my medical decision making (see chart for details).    Patient is a 40 year old female with chief complaint of diarrhea that started Sunday evening.  Initially she reported heavy feeling, fatigue, and headache but those symptoms improved over the next couple days but she has continued to have diarrhea since then.  She reports 8-10 episodes last 24 hours have been watery but nonbloody.  Imodium has helped some.  Her son had similar symptoms with a fever a week prior and her husband has also started having similar symptoms.  Leaning more towards a viral but given her 80s episodes last 24 hours.   Check a GI PCR.  Patient unable to give a sample so we will send her home with a kit.  Have her treat symptoms as needed at home recommend probiotic and scheduling the Imodium.  Follow-up should symptoms worsen or not improve.  Final Clinical  Impressions(s) / UC Diagnoses   Final diagnoses:  Diarrhea, unspecified type     Discharge Instructions      -Symptoms most likely viral in nature.  Regular heart rate is indicative of normal fluid levels.  Antibiotics generally not prescribed without any known cause as different bacteria respond to different antibiotics.  Also, want to avoid increasing resistance. -Can add over-the-counter probiotics to help improve normal GI flora. -Continue to push fluids -Information attached with food choices that may help with the diarrhea. -Can continue with Imodium but on a more scheduled basis to help with symptoms. -Can return with the GI study sample to to help detect possible cause. -Return to clinic or follow-up with PCP should symptoms worsen or not improve.     ED Prescriptions   None    PDMP not reviewed this encounter.   Luvenia Redden, PA-C 10/05/20 [phone]

## 2020-10-05 NOTE — ED Triage Notes (Addendum)
Pt c/o diarrhea for about 6 days. Pt states the first day she did have headache and fatigue, this has improved. Pt has been taking Imodium and this has helped some. Pt states her husband now also has the same symptoms, and her son had same last week along with fever. Pt denies abd pain, n/v or other symptoms.

## 2021-02-17 ENCOUNTER — Other Ambulatory Visit: Payer: Self-pay

## 2021-02-17 ENCOUNTER — Ambulatory Visit
Admission: EM | Admit: 2021-02-17 | Discharge: 2021-02-17 | Disposition: A | Discharge status: Home / Self Care | Payer: BC Managed Care – PPO | Attending: Emergency Medicine | Admitting: Emergency Medicine

## 2021-02-17 DIAGNOSIS — R197 Diarrhea, unspecified: Secondary | ICD-10-CM | POA: Diagnosis not present

## 2021-02-17 MED ORDER — DICYCLOMINE HCL 20 MG PO TABS: 20.0000 mg | ORAL_TABLET | Freq: Two times a day (BID) | ORAL | 0 refills | Status: AC

## 2021-02-17 NOTE — Discharge Instructions (Addendum)
Use the Bentyl every 6 hours as needed for any abdominal cramping you may have.  Continue to rehydrate using beverages of your choice as maintaining hydration is most important given your diarrhea stools.  If you are able to collect a stool specimen please return it to the lab for evaluation.  Avoid using any further Imodium as this can cause inflammation of your bowel and lead to further complications.  Follow a list of food choices given in your discharge paperwork to help you resolve your diarrhea.

## 2021-02-17 NOTE — ED Provider Notes (Signed)
[facility]    CSN: [id number] Arrival date & time: 02/17/21  1534      History   Chief Complaint Chief Complaint  Patient presents with   Diarrhea    HPI JAJAIRA RUIS is a 41 y.o. female.   HPI  41 year old female here for evaluation of GI issues.  Patient reports that she has been experiencing diarrhea for the past week.  It has become worse in the last 2 days and is increased about 5-6 stools a day.  She took some Imodium yesterday which did help resolve her stools but then caused her to have sharp pain in the right upper quadrant of her abdomen that radiated to her right shoulder.  This pain resolved after approximately 30 minutes.  She reports that her stools have been lighter in color but she also endorses that she has been eating a low residue diet consisting mostly of bananas and other bland foods.  She denies any fever, nausea or vomiting, or urinary complaints.  She denies any recent travel, eating anything suspect, or other persons in her household with similar illness.  History reviewed. No pertinent past medical history.  Patient Active Problem List   Diagnosis Date Noted   KNEE PAIN, LEFT 04/09/2010   PATELLAR TENDINITIS 04/09/2010    Past Surgical History:  Procedure Laterality Date   breast augmentation   2011   C-sections     two emergency c-sections 2013, 2016   LIPOSUCTION EXTREMITIES  2011    OB History   No obstetric history on file.      Home Medications    Prior to Admission medications   Medication Sig Start Date End Date Taking? Authorizing Provider  cholecalciferol (VITAMIN D3) 25 MCG (1000 UNIT) tablet Take 1,000 Units by mouth daily.   Yes [provider]  dicyclomine (BENTYL) 20 MG tablet Take 1 tablet (20 mg total) by mouth 2 (two) times daily. 02/17/21  Yes Becky Augusta, NP  fluticasone [facility]) 50 MCG/ACT nasal spray Place into both nostrils daily.   Yes [provider]  Multiple Vitamin  (MULTIVITAMIN) tablet Take 1 tablet by mouth daily.   Yes [provider]    Family History Family History  Problem Relation Age of Onset   Healthy Mother    Stroke Father     Social History Social History   Tobacco Use   Smoking status: Never   Smokeless tobacco: Never  Vaping Use   Vaping Use: Never used  Substance Use Topics   Alcohol use: Yes   Drug use: No     Allergies   Sulfa antibiotics and Sulfonamide derivatives   Review of Systems Review of Systems  Gastrointestinal:  Positive for abdominal pain and diarrhea. Negative for nausea and vomiting.  Genitourinary:  Negative for dysuria, frequency and urgency.  Musculoskeletal:  Negative for back pain.  Skin:  Negative for rash.  Hematological: Negative.   Psychiatric/Behavioral: Negative.      Physical Exam Triage Vital Signs ED Triage Vitals  Enc Vitals Group     BP 02/17/21 1543 105/74     Pulse Rate 02/17/21 1543 62     Resp 02/17/21 1543 18     Temp 02/17/21 1543 98.4 F (36.9 C)     Temp Source 02/17/21 1543 Oral     SpO2 02/17/21 1543 100 %     Weight 02/17/21 1541 145 lb (65.8 kg)     Height 02/17/21 1541 5\' 8"  (1.727 m)  Head Circumference --      Peak Flow --      Pain Score 02/17/21 1541 1     Pain Loc --      Pain Edu? --      Excl. in GC? --    No data found.  Updated Vital Signs BP 105/74 (BP Location: Left Arm)    Pulse 62    Temp 98.4 F (36.9 C) (Oral)    Resp 18    Ht 5\' 8"  (1.727 m)    Wt 145 lb (65.8 kg)    LMP 02/01/2021    SpO2 100%    BMI 22.05 kg/m   Visual Acuity Right Eye Distance:   Left Eye Distance:   Bilateral Distance:    Right Eye Near:   Left Eye Near:    Bilateral Near:     Physical Exam Vitals and nursing note reviewed.  Constitutional:      General: She is not in acute distress.    Appearance: Normal appearance. She is normal weight. She is not ill-appearing.  HENT:     Head: Normocephalic and atraumatic.  Cardiovascular:     Rate and  Rhythm: Normal rate and regular rhythm.     Pulses: Normal pulses.     Heart sounds: Normal heart sounds. No murmur heard.   No friction rub. No gallop.  Pulmonary:     Effort: Pulmonary effort is normal.     Breath sounds: Normal breath sounds. No wheezing, rhonchi or rales.  Abdominal:     General: Abdomen is flat. Bowel sounds are normal.     Palpations: Abdomen is soft.     Tenderness: There is abdominal tenderness. There is no guarding or rebound.  Skin:    General: Skin is warm and dry.     Capillary Refill: Capillary refill takes less than 2 seconds.     Findings: No erythema or rash.  Neurological:     General: No focal deficit present.     Mental Status: She is alert and oriented to person, place, and time.  Psychiatric:        Mood and Affect: Mood normal.        Behavior: Behavior normal.        Thought Content: Thought content normal.        Judgment: Judgment normal.     UC Treatments / Results  Labs (all labs ordered are listed, but only abnormal results are displayed) Labs Reviewed - No data to display  EKG   Radiology No results found.  Procedures Procedures (including critical care time)  Medications Ordered in UC Medications - No data to display  Initial Impression / Assessment and Plan / UC Course  I have reviewed the triage vital signs and the nursing notes.  Pertinent labs & imaging results that were available during my care of the patient were reviewed by me and considered in my medical decision making (see chart for details).  Is a very pleasant, nontoxic-appearing 41 year old female here with evaluation of diarrhea that is been going on for the past week.  She states that her stools have been lighter in color and more tan like during the past week but she is also been eating bland foods and low residue foods.  She has not had her normal dietary intake.  No other members of her household are sick and she denies any recent antibiotic use or recent  travel.  This is not associated with fever, nausea, or vomiting.  She did have one 30-minute episode of right upper quadrant abdominal pain that radiated to her right shoulder but not to her back that resolved spontaneously.  She does have her gallbladder.  On physical exam she has benign cardiopulmonary exam with clear lung sounds all fields.  Abdomen is soft, flat, with mild left upper quadrant tenderness.  Right upper quadrant, right lower quadrant, and left lower quadrant are all benign.  There is no guarding or rebound.  No hepatosplenomegaly appreciated on exam.  Unclear the etiology of patient's diarrhea though I believe some of it is being precipitated by the fact that she is following a low residue diet.  I will give her a list of food choices to help resolve her diarrhea stools.  I will also collect a GI pathogen's panel to look for any abnormalities.  We will give Bentyl that she needs every 6 hours as needed for any abdominal cramping.  She is to continue oral hydration as directed.   Final Clinical Impressions(s) / UC Diagnoses   Final diagnoses:  Diarrhea, unspecified type     Discharge Instructions      Use the Bentyl every 6 hours as needed for any abdominal cramping you may have.  Continue to rehydrate using beverages of your choice as maintaining hydration is most important given your diarrhea stools.  If you are able to collect a stool specimen please return it to the lab for evaluation.  Avoid using any further Imodium as this can cause inflammation of your bowel and lead to further complications.  Follow a list of food choices given in your discharge paperwork to help you resolve your diarrhea.     ED Prescriptions     Medication Sig Dispense Auth. Provider   dicyclomine (BENTYL) 20 MG tablet Take 1 tablet (20 mg total) by mouth 2 (two) times daily. 20 tablet Becky Augustayan, [place], NP      PDMP not reviewed this encounter.   Becky Augustayan, [place] Rogel, NP 02/17/21 1622

## 2021-02-17 NOTE — ED Triage Notes (Signed)
Pt here with C/O diarrhea for 1 week, getting worst. Stool is pale color. No ABX treatment recently, no travel out of country.
